# Patient Record
Sex: Female | Born: 1985 | Race: Black or African American | Hispanic: No | Marital: Single | Smoking: Current every day smoker
Health system: Southern US, Community
[De-identification: ages and names within clinical notes are randomized; demographics above are authoritative.]

## PROBLEM LIST (undated history)

## (undated) DIAGNOSIS — F32A Depression, unspecified: Secondary | ICD-10-CM

## (undated) DIAGNOSIS — Z3046 Encounter for surveillance of implantable subdermal contraceptive: Secondary | ICD-10-CM

## (undated) DIAGNOSIS — F419 Anxiety disorder, unspecified: Secondary | ICD-10-CM

## (undated) DIAGNOSIS — F329 Major depressive disorder, single episode, unspecified: Secondary | ICD-10-CM

## (undated) DIAGNOSIS — O24419 Gestational diabetes mellitus in pregnancy, unspecified control: Secondary | ICD-10-CM

## (undated) HISTORY — DX: Depression, unspecified: F32.A

## (undated) HISTORY — DX: Major depressive disorder, single episode, unspecified: F32.9

## (undated) HISTORY — DX: Anxiety disorder, unspecified: F41.9

## (undated) HISTORY — DX: Gestational diabetes mellitus in pregnancy, unspecified control: O24.419

---
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
## 2005-08-14 ENCOUNTER — Inpatient Hospital Stay (HOSPITAL_COMMUNITY): Admission: AD | Admit: 2005-08-14 | Discharge: 2005-08-14 | Payer: Self-pay | Admitting: Family Medicine

## 2005-08-21 ENCOUNTER — Inpatient Hospital Stay (HOSPITAL_COMMUNITY): Admission: AD | Admit: 2005-08-21 | Discharge: 2005-08-21 | Payer: Self-pay | Admitting: *Deleted

## 2006-03-21 ENCOUNTER — Ambulatory Visit: Payer: Self-pay | Admitting: Obstetrics and Gynecology

## 2006-04-08 ENCOUNTER — Inpatient Hospital Stay (HOSPITAL_COMMUNITY): Admission: AD | Admit: 2006-04-08 | Discharge: 2006-04-08 | Payer: Self-pay | Admitting: Gynecology

## 2006-04-27 ENCOUNTER — Ambulatory Visit: Payer: Self-pay | Admitting: Family Medicine

## 2006-05-22 ENCOUNTER — Ambulatory Visit: Payer: Self-pay | Admitting: Obstetrics & Gynecology

## 2006-06-19 ENCOUNTER — Ambulatory Visit: Payer: Self-pay | Admitting: Obstetrics & Gynecology

## 2006-06-19 ENCOUNTER — Ambulatory Visit (HOSPITAL_COMMUNITY): Admission: RE | Admit: 2006-06-19 | Discharge: 2006-06-19 | Payer: Self-pay | Admitting: Obstetrics & Gynecology

## 2006-06-21 ENCOUNTER — Ambulatory Visit (HOSPITAL_COMMUNITY): Admission: RE | Admit: 2006-06-21 | Discharge: 2006-06-21 | Payer: Self-pay | Admitting: Obstetrics & Gynecology

## 2006-06-26 ENCOUNTER — Ambulatory Visit: Payer: Self-pay | Admitting: Obstetrics & Gynecology

## 2006-06-26 ENCOUNTER — Ambulatory Visit (HOSPITAL_COMMUNITY): Admission: RE | Admit: 2006-06-26 | Discharge: 2006-06-26 | Payer: Self-pay | Admitting: Obstetrics & Gynecology

## 2006-07-13 ENCOUNTER — Ambulatory Visit: Payer: Self-pay | Admitting: Obstetrics & Gynecology

## 2006-07-17 ENCOUNTER — Ambulatory Visit (HOSPITAL_COMMUNITY): Admission: RE | Admit: 2006-07-17 | Discharge: 2006-07-17 | Payer: Self-pay | Admitting: Obstetrics & Gynecology

## 2006-07-24 ENCOUNTER — Ambulatory Visit: Payer: Self-pay | Admitting: Obstetrics & Gynecology

## 2006-07-25 ENCOUNTER — Ambulatory Visit (HOSPITAL_COMMUNITY): Admission: RE | Admit: 2006-07-25 | Discharge: 2006-07-25 | Payer: Self-pay | Admitting: Obstetrics & Gynecology

## 2006-07-28 ENCOUNTER — Ambulatory Visit (HOSPITAL_COMMUNITY): Admission: RE | Admit: 2006-07-28 | Discharge: 2006-07-28 | Payer: Self-pay | Admitting: Obstetrics & Gynecology

## 2006-07-29 ENCOUNTER — Inpatient Hospital Stay (HOSPITAL_COMMUNITY): Admission: AD | Admit: 2006-07-29 | Discharge: 2006-07-29 | Payer: Self-pay | Admitting: Gynecology

## 2006-07-31 ENCOUNTER — Ambulatory Visit: Payer: Self-pay | Admitting: Family Medicine

## 2006-07-31 ENCOUNTER — Ambulatory Visit (HOSPITAL_COMMUNITY): Admission: RE | Admit: 2006-07-31 | Discharge: 2006-07-31 | Payer: Self-pay | Admitting: Obstetrics & Gynecology

## 2006-08-03 ENCOUNTER — Inpatient Hospital Stay (HOSPITAL_COMMUNITY): Admission: AD | Admit: 2006-08-03 | Discharge: 2006-08-03 | Payer: Self-pay | Admitting: Gynecology

## 2006-08-03 ENCOUNTER — Ambulatory Visit: Payer: Self-pay | Admitting: *Deleted

## 2006-08-07 ENCOUNTER — Ambulatory Visit: Payer: Self-pay | Admitting: Obstetrics & Gynecology

## 2006-08-10 ENCOUNTER — Ambulatory Visit: Payer: Self-pay | Admitting: Family Medicine

## 2006-08-11 ENCOUNTER — Ambulatory Visit (HOSPITAL_COMMUNITY): Admission: RE | Admit: 2006-08-11 | Discharge: 2006-08-11 | Payer: Self-pay | Admitting: Obstetrics & Gynecology

## 2006-08-14 ENCOUNTER — Ambulatory Visit: Payer: Self-pay | Admitting: *Deleted

## 2006-08-14 ENCOUNTER — Observation Stay (HOSPITAL_COMMUNITY): Admission: RE | Admit: 2006-08-14 | Discharge: 2006-08-15 | Payer: Self-pay | Admitting: Obstetrics & Gynecology

## 2006-08-14 ENCOUNTER — Ambulatory Visit: Payer: Self-pay | Admitting: Gynecology

## 2006-08-16 ENCOUNTER — Ambulatory Visit (HOSPITAL_COMMUNITY): Admission: RE | Admit: 2006-08-16 | Discharge: 2006-08-16 | Payer: Self-pay | Admitting: Obstetrics & Gynecology

## 2006-08-17 ENCOUNTER — Encounter: Payer: Self-pay | Admitting: *Deleted

## 2006-08-18 ENCOUNTER — Encounter: Payer: Self-pay | Admitting: *Deleted

## 2006-08-18 ENCOUNTER — Inpatient Hospital Stay (HOSPITAL_COMMUNITY): Admission: AD | Admit: 2006-08-18 | Discharge: 2006-08-24 | Payer: Self-pay | Admitting: Obstetrics and Gynecology

## 2006-08-18 ENCOUNTER — Ambulatory Visit: Payer: Self-pay | Admitting: Obstetrics & Gynecology

## 2006-08-18 ENCOUNTER — Ambulatory Visit: Payer: Self-pay | Admitting: *Deleted

## 2006-08-18 ENCOUNTER — Ambulatory Visit: Payer: Self-pay | Admitting: Neonatology

## 2006-08-19 ENCOUNTER — Ambulatory Visit: Payer: Self-pay | Admitting: Neonatology

## 2006-08-22 DIAGNOSIS — O365990 Maternal care for other known or suspected poor fetal growth, unspecified trimester, not applicable or unspecified: Secondary | ICD-10-CM

## 2006-08-25 ENCOUNTER — Inpatient Hospital Stay (HOSPITAL_COMMUNITY): Admission: AD | Admit: 2006-08-25 | Discharge: 2006-08-25 | Payer: Self-pay | Admitting: Gynecology

## 2006-08-25 ENCOUNTER — Ambulatory Visit: Payer: Self-pay | Admitting: Obstetrics and Gynecology

## 2006-09-03 ENCOUNTER — Inpatient Hospital Stay (HOSPITAL_COMMUNITY): Admission: AD | Admit: 2006-09-03 | Discharge: 2006-09-03 | Payer: Self-pay | Admitting: Obstetrics & Gynecology

## 2006-10-19 ENCOUNTER — Ambulatory Visit: Payer: Self-pay | Admitting: *Deleted

## 2006-10-19 ENCOUNTER — Inpatient Hospital Stay (HOSPITAL_COMMUNITY): Admission: AD | Admit: 2006-10-19 | Discharge: 2006-10-19 | Payer: Self-pay | Admitting: Obstetrics and Gynecology

## 2007-06-16 ENCOUNTER — Emergency Department (HOSPITAL_COMMUNITY): Admission: EM | Admit: 2007-06-16 | Discharge: 2007-06-16 | Payer: Self-pay | Admitting: Emergency Medicine

## 2007-12-16 ENCOUNTER — Emergency Department (HOSPITAL_COMMUNITY): Admission: EM | Admit: 2007-12-16 | Discharge: 2007-12-16 | Payer: Self-pay | Admitting: Family Medicine

## 2008-05-21 ENCOUNTER — Encounter: Admission: RE | Admit: 2008-05-21 | Discharge: 2008-05-21 | Payer: Self-pay | Admitting: Chiropractic Medicine

## 2008-08-03 ENCOUNTER — Emergency Department (HOSPITAL_COMMUNITY): Admission: EM | Admit: 2008-08-03 | Discharge: 2008-08-03 | Payer: Self-pay | Admitting: Family Medicine

## 2008-08-03 ENCOUNTER — Inpatient Hospital Stay (HOSPITAL_COMMUNITY): Admission: AD | Admit: 2008-08-03 | Discharge: 2008-08-04 | Payer: Self-pay | Admitting: Obstetrics & Gynecology

## 2008-08-04 ENCOUNTER — Emergency Department (HOSPITAL_COMMUNITY): Admission: EM | Admit: 2008-08-04 | Discharge: 2008-08-04 | Payer: Self-pay | Admitting: Emergency Medicine

## 2008-08-06 ENCOUNTER — Inpatient Hospital Stay (HOSPITAL_COMMUNITY): Admission: AD | Admit: 2008-08-06 | Discharge: 2008-08-06 | Payer: Self-pay | Admitting: Family Medicine

## 2008-08-09 ENCOUNTER — Emergency Department (HOSPITAL_COMMUNITY): Admission: EM | Admit: 2008-08-09 | Discharge: 2008-08-09 | Payer: Self-pay | Admitting: Emergency Medicine

## 2008-08-13 ENCOUNTER — Inpatient Hospital Stay (HOSPITAL_COMMUNITY): Admission: RE | Admit: 2008-08-13 | Discharge: 2008-08-13 | Payer: Self-pay | Admitting: Family Medicine

## 2008-08-22 ENCOUNTER — Inpatient Hospital Stay (HOSPITAL_COMMUNITY): Admission: AD | Admit: 2008-08-22 | Discharge: 2008-08-22 | Payer: Self-pay | Admitting: Obstetrics and Gynecology

## 2008-08-28 ENCOUNTER — Ambulatory Visit: Payer: Self-pay | Admitting: Family Medicine

## 2008-09-10 ENCOUNTER — Inpatient Hospital Stay (HOSPITAL_COMMUNITY): Admission: AD | Admit: 2008-09-10 | Discharge: 2008-09-10 | Payer: Self-pay | Admitting: Obstetrics and Gynecology

## 2008-09-11 ENCOUNTER — Ambulatory Visit: Payer: Self-pay | Admitting: Obstetrics & Gynecology

## 2008-09-15 ENCOUNTER — Inpatient Hospital Stay (HOSPITAL_COMMUNITY): Admission: AD | Admit: 2008-09-15 | Discharge: 2008-09-16 | Payer: Self-pay | Admitting: Family Medicine

## 2008-09-22 ENCOUNTER — Ambulatory Visit (HOSPITAL_COMMUNITY): Admission: RE | Admit: 2008-09-22 | Discharge: 2008-09-22 | Payer: Self-pay | Admitting: Family Medicine

## 2008-09-25 ENCOUNTER — Ambulatory Visit: Payer: Self-pay | Admitting: Family Medicine

## 2008-10-01 ENCOUNTER — Inpatient Hospital Stay (HOSPITAL_COMMUNITY): Admission: AD | Admit: 2008-10-01 | Discharge: 2008-10-01 | Payer: Self-pay | Admitting: Family Medicine

## 2008-10-13 ENCOUNTER — Inpatient Hospital Stay (HOSPITAL_COMMUNITY): Admission: AD | Admit: 2008-10-13 | Discharge: 2008-10-13 | Payer: Self-pay | Admitting: Obstetrics & Gynecology

## 2008-10-16 ENCOUNTER — Ambulatory Visit: Payer: Self-pay | Admitting: Family Medicine

## 2008-10-21 ENCOUNTER — Inpatient Hospital Stay (HOSPITAL_COMMUNITY): Admission: AD | Admit: 2008-10-21 | Discharge: 2008-10-21 | Payer: Self-pay | Admitting: Obstetrics & Gynecology

## 2008-10-22 ENCOUNTER — Inpatient Hospital Stay (HOSPITAL_COMMUNITY): Admission: AD | Admit: 2008-10-22 | Discharge: 2008-10-22 | Payer: Self-pay | Admitting: Obstetrics & Gynecology

## 2008-10-30 ENCOUNTER — Ambulatory Visit: Payer: Self-pay | Admitting: Obstetrics & Gynecology

## 2008-11-10 ENCOUNTER — Ambulatory Visit (HOSPITAL_COMMUNITY): Admission: RE | Admit: 2008-11-10 | Discharge: 2008-11-10 | Payer: Self-pay | Admitting: Family Medicine

## 2008-11-13 ENCOUNTER — Ambulatory Visit: Payer: Self-pay | Admitting: Family Medicine

## 2008-11-27 ENCOUNTER — Encounter: Payer: Self-pay | Admitting: Obstetrics & Gynecology

## 2008-11-27 ENCOUNTER — Ambulatory Visit: Payer: Self-pay | Admitting: Family Medicine

## 2008-12-08 ENCOUNTER — Ambulatory Visit (HOSPITAL_COMMUNITY): Admission: RE | Admit: 2008-12-08 | Discharge: 2008-12-08 | Payer: Self-pay | Admitting: Family Medicine

## 2008-12-15 ENCOUNTER — Ambulatory Visit (HOSPITAL_COMMUNITY): Admission: RE | Admit: 2008-12-15 | Discharge: 2008-12-15 | Payer: Self-pay | Admitting: Family Medicine

## 2008-12-22 ENCOUNTER — Ambulatory Visit (HOSPITAL_COMMUNITY): Admission: RE | Admit: 2008-12-22 | Discharge: 2008-12-22 | Payer: Self-pay | Admitting: Family Medicine

## 2008-12-29 ENCOUNTER — Encounter: Payer: Self-pay | Admitting: *Deleted

## 2008-12-29 ENCOUNTER — Inpatient Hospital Stay (HOSPITAL_COMMUNITY): Admission: AD | Admit: 2008-12-29 | Discharge: 2008-12-29 | Payer: Self-pay | Admitting: Obstetrics & Gynecology

## 2008-12-29 ENCOUNTER — Ambulatory Visit: Payer: Self-pay | Admitting: Physician Assistant

## 2009-01-01 ENCOUNTER — Ambulatory Visit: Payer: Self-pay | Admitting: Family Medicine

## 2009-01-05 ENCOUNTER — Ambulatory Visit (HOSPITAL_COMMUNITY): Admission: RE | Admit: 2009-01-05 | Discharge: 2009-01-05 | Payer: Self-pay | Admitting: Family Medicine

## 2009-01-14 ENCOUNTER — Ambulatory Visit (HOSPITAL_COMMUNITY): Admission: RE | Admit: 2009-01-14 | Discharge: 2009-01-14 | Payer: Self-pay | Admitting: Family Medicine

## 2009-01-19 ENCOUNTER — Ambulatory Visit: Payer: Self-pay | Admitting: Obstetrics & Gynecology

## 2009-01-19 ENCOUNTER — Ambulatory Visit (HOSPITAL_COMMUNITY): Admission: RE | Admit: 2009-01-19 | Discharge: 2009-01-19 | Payer: Self-pay | Admitting: Family Medicine

## 2009-01-19 LAB — CONVERTED CEMR LAB

## 2009-01-20 ENCOUNTER — Encounter: Payer: Self-pay | Admitting: Obstetrics & Gynecology

## 2009-01-20 LAB — CONVERTED CEMR LAB
HCT: 31.7 % — ABNORMAL LOW (ref 36.0–46.0)
Hemoglobin: 9.8 g/dL — ABNORMAL LOW (ref 12.0–15.0)
MCHC: 30.9 g/dL (ref 30.0–36.0)
MCV: 83.2 fL (ref 78.0–100.0)
RBC: 3.81 M/uL — ABNORMAL LOW (ref 3.87–5.11)

## 2009-01-26 ENCOUNTER — Ambulatory Visit (HOSPITAL_COMMUNITY): Admission: RE | Admit: 2009-01-26 | Discharge: 2009-01-26 | Payer: Self-pay | Admitting: Family Medicine

## 2009-01-26 ENCOUNTER — Encounter: Payer: Self-pay | Admitting: Family Medicine

## 2009-02-02 ENCOUNTER — Observation Stay (HOSPITAL_COMMUNITY): Admission: AD | Admit: 2009-02-02 | Discharge: 2009-02-02 | Payer: Self-pay | Admitting: Obstetrics & Gynecology

## 2009-02-02 ENCOUNTER — Encounter: Payer: Self-pay | Admitting: Family Medicine

## 2009-02-02 ENCOUNTER — Ambulatory Visit: Payer: Self-pay | Admitting: Family Medicine

## 2009-02-03 ENCOUNTER — Inpatient Hospital Stay (HOSPITAL_COMMUNITY): Admission: AD | Admit: 2009-02-03 | Discharge: 2009-02-03 | Payer: Self-pay | Admitting: Obstetrics & Gynecology

## 2009-02-05 ENCOUNTER — Inpatient Hospital Stay (HOSPITAL_COMMUNITY): Admission: AD | Admit: 2009-02-05 | Discharge: 2009-02-05 | Payer: Self-pay | Admitting: Obstetrics & Gynecology

## 2009-02-05 ENCOUNTER — Ambulatory Visit: Payer: Self-pay | Admitting: Family

## 2009-02-09 ENCOUNTER — Ambulatory Visit (HOSPITAL_COMMUNITY): Admission: RE | Admit: 2009-02-09 | Discharge: 2009-02-09 | Payer: Self-pay | Admitting: Obstetrics & Gynecology

## 2009-02-17 ENCOUNTER — Ambulatory Visit (HOSPITAL_COMMUNITY): Admission: RE | Admit: 2009-02-17 | Discharge: 2009-02-17 | Payer: Self-pay | Admitting: Obstetrics & Gynecology

## 2009-02-19 ENCOUNTER — Ambulatory Visit (HOSPITAL_COMMUNITY): Admission: RE | Admit: 2009-02-19 | Discharge: 2009-02-19 | Payer: Self-pay | Admitting: Obstetrics & Gynecology

## 2009-02-23 ENCOUNTER — Ambulatory Visit (HOSPITAL_COMMUNITY): Admission: RE | Admit: 2009-02-23 | Discharge: 2009-02-23 | Payer: Self-pay | Admitting: Obstetrics & Gynecology

## 2009-02-27 ENCOUNTER — Ambulatory Visit (HOSPITAL_COMMUNITY): Admission: RE | Admit: 2009-02-27 | Discharge: 2009-02-27 | Payer: Self-pay | Admitting: Obstetrics & Gynecology

## 2009-03-02 ENCOUNTER — Ambulatory Visit (HOSPITAL_COMMUNITY): Admission: RE | Admit: 2009-03-02 | Discharge: 2009-03-02 | Payer: Self-pay | Admitting: Obstetrics & Gynecology

## 2009-03-05 ENCOUNTER — Inpatient Hospital Stay (HOSPITAL_COMMUNITY): Admission: AD | Admit: 2009-03-05 | Discharge: 2009-03-05 | Payer: Self-pay | Admitting: Obstetrics & Gynecology

## 2009-03-06 ENCOUNTER — Inpatient Hospital Stay (HOSPITAL_COMMUNITY): Admission: AD | Admit: 2009-03-06 | Discharge: 2009-03-06 | Payer: Self-pay | Admitting: Obstetrics & Gynecology

## 2009-03-09 ENCOUNTER — Encounter: Payer: Self-pay | Admitting: Obstetrics & Gynecology

## 2009-03-09 ENCOUNTER — Inpatient Hospital Stay (HOSPITAL_COMMUNITY): Admission: RE | Admit: 2009-03-09 | Discharge: 2009-03-12 | Payer: Self-pay | Admitting: Obstetrics & Gynecology

## 2009-03-09 ENCOUNTER — Ambulatory Visit: Payer: Self-pay | Admitting: Obstetrics & Gynecology

## 2009-03-09 DIAGNOSIS — Z98891 History of uterine scar from previous surgery: Secondary | ICD-10-CM

## 2009-07-02 ENCOUNTER — Emergency Department (HOSPITAL_COMMUNITY): Admission: EM | Admit: 2009-07-02 | Discharge: 2009-07-02 | Payer: Self-pay | Admitting: Family Medicine

## 2009-08-05 ENCOUNTER — Inpatient Hospital Stay (HOSPITAL_COMMUNITY): Admission: AD | Admit: 2009-08-05 | Discharge: 2009-08-06 | Payer: Self-pay | Admitting: Obstetrics & Gynecology

## 2009-08-12 ENCOUNTER — Emergency Department (HOSPITAL_COMMUNITY): Admission: EM | Admit: 2009-08-12 | Discharge: 2009-08-12 | Payer: Self-pay | Admitting: Emergency Medicine

## 2009-08-16 ENCOUNTER — Inpatient Hospital Stay (HOSPITAL_COMMUNITY): Admission: AD | Admit: 2009-08-16 | Discharge: 2009-08-17 | Payer: Self-pay | Admitting: Obstetrics and Gynecology

## 2009-08-17 ENCOUNTER — Ambulatory Visit (HOSPITAL_COMMUNITY): Admission: AD | Admit: 2009-08-17 | Discharge: 2009-08-17 | Payer: Self-pay | Admitting: Family Medicine

## 2009-08-17 ENCOUNTER — Ambulatory Visit: Payer: Self-pay | Admitting: Obstetrics & Gynecology

## 2009-08-19 ENCOUNTER — Emergency Department (HOSPITAL_COMMUNITY): Admission: EM | Admit: 2009-08-19 | Discharge: 2009-08-19 | Payer: Self-pay | Admitting: Emergency Medicine

## 2009-08-20 ENCOUNTER — Inpatient Hospital Stay (HOSPITAL_COMMUNITY): Admission: AD | Admit: 2009-08-20 | Discharge: 2009-08-20 | Payer: Self-pay | Admitting: Family Medicine

## 2009-08-20 ENCOUNTER — Emergency Department (HOSPITAL_COMMUNITY): Admission: EM | Admit: 2009-08-20 | Discharge: 2009-08-20 | Payer: Self-pay | Admitting: Emergency Medicine

## 2009-08-20 ENCOUNTER — Ambulatory Visit: Payer: Self-pay | Admitting: Obstetrics & Gynecology

## 2009-11-06 ENCOUNTER — Emergency Department (HOSPITAL_COMMUNITY): Admission: EM | Admit: 2009-11-06 | Discharge: 2009-11-06 | Payer: Self-pay | Admitting: Emergency Medicine

## 2009-11-09 ENCOUNTER — Inpatient Hospital Stay (HOSPITAL_COMMUNITY): Admission: AD | Admit: 2009-11-09 | Discharge: 2009-11-09 | Payer: Self-pay | Admitting: Family Medicine

## 2009-11-11 ENCOUNTER — Ambulatory Visit (HOSPITAL_COMMUNITY): Admission: RE | Admit: 2009-11-11 | Discharge: 2009-11-11 | Payer: Self-pay | Admitting: Obstetrics & Gynecology

## 2009-11-17 ENCOUNTER — Emergency Department (HOSPITAL_COMMUNITY): Admission: EM | Admit: 2009-11-17 | Discharge: 2009-11-17 | Payer: Self-pay | Admitting: Emergency Medicine

## 2009-11-27 IMAGING — US US FETAL BPP W/O NONSTRESS
1 series · 3 of 3 positions shown · non-contrast
Comparison: none

OBSTETRICAL ULTRASOUND:
 This ultrasound exam was performed in the [HOSPITAL] Ultrasound Department.  The OB US report was generated in the AS system, and faxed to the ordering physician.  This report is also available in [REDACTED] PACS.

[Series 1: us fetal bpp w/o nonstress · non-contrast · 3 of 3 slices shown]
[im 1/3]
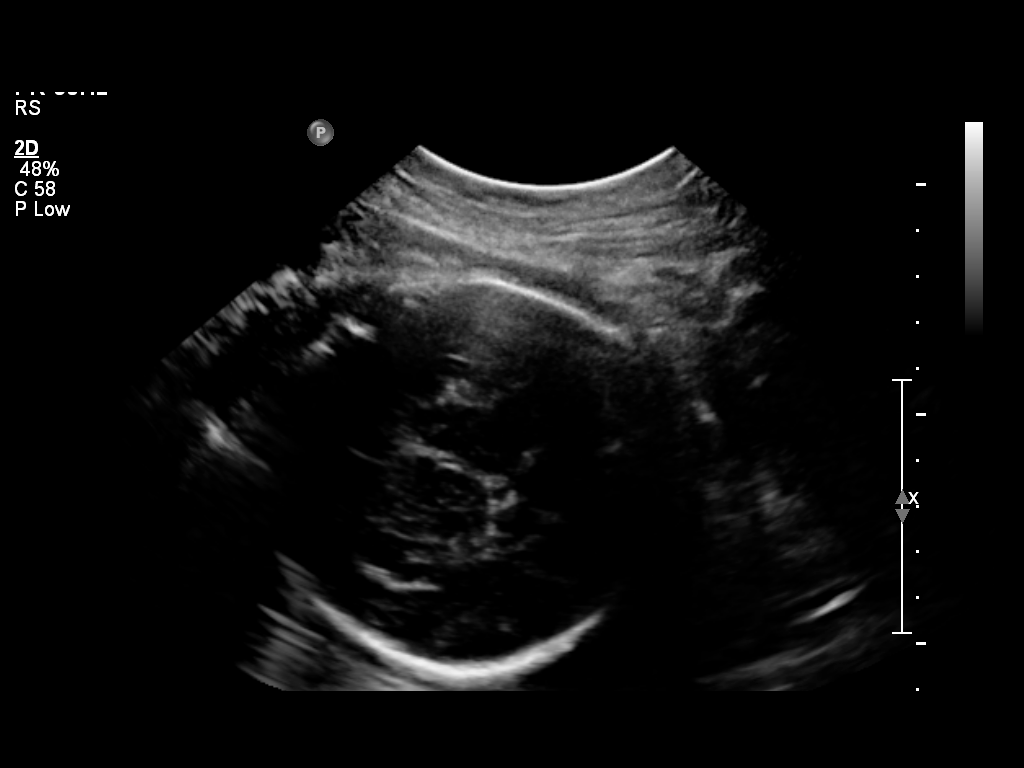
[im 2/3]
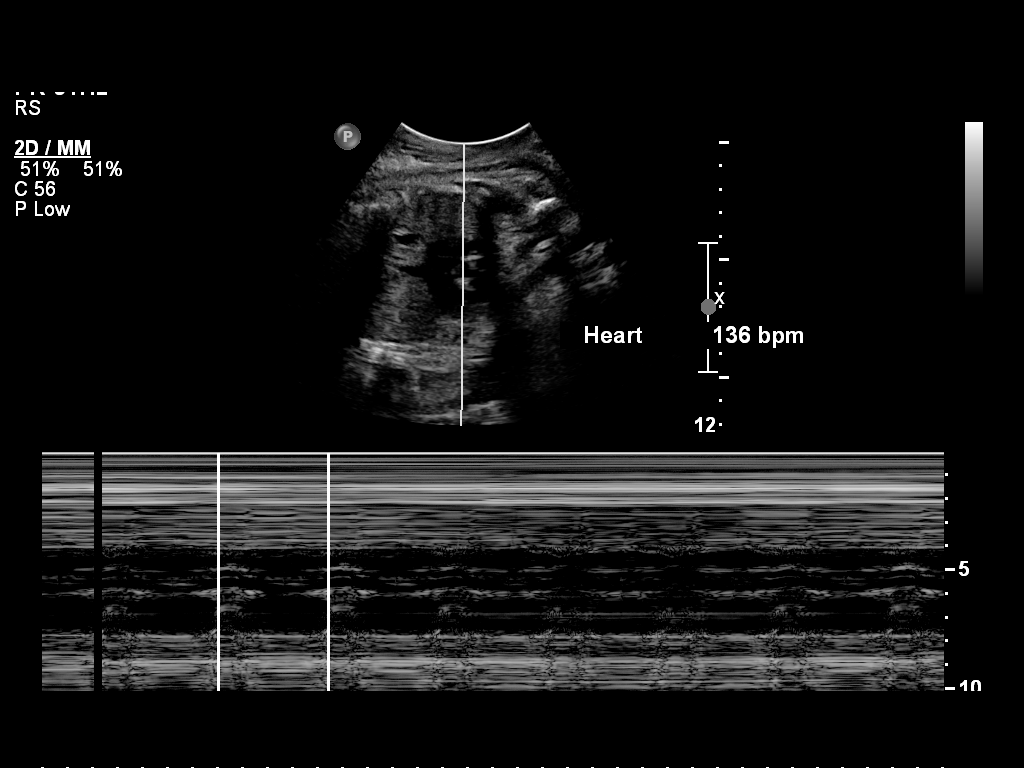
[im 3/3]
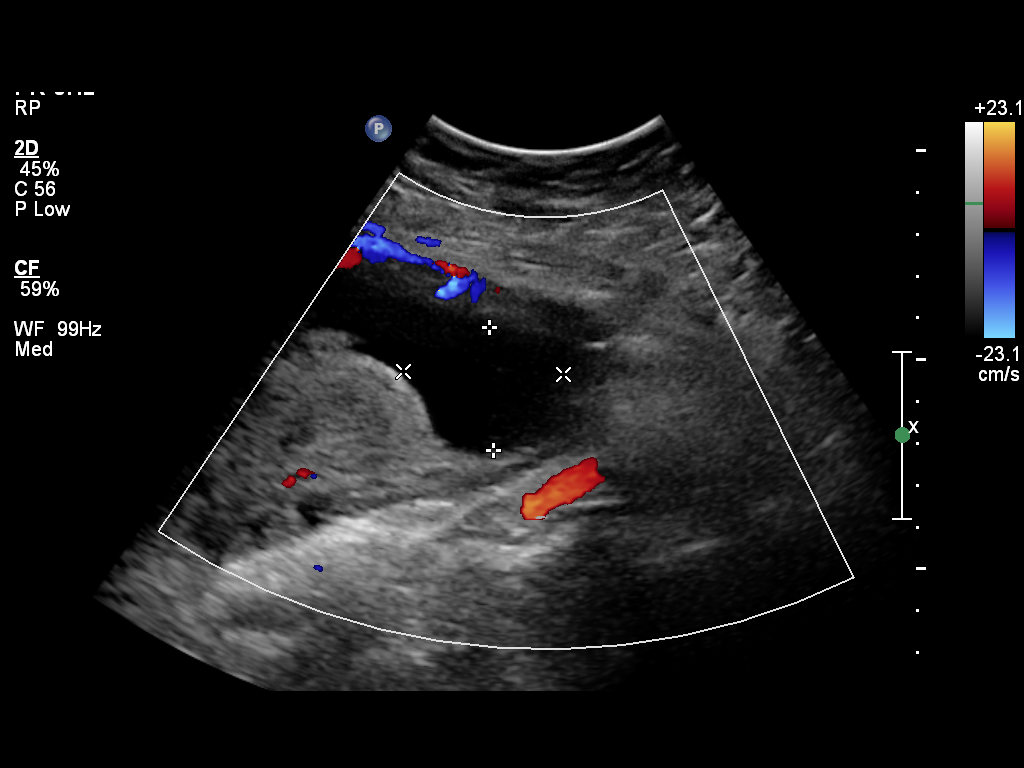

[3 of 3 positions shown; findings below may reference images not displayed]

IMPRESSION: See AS Obstetric US report.

## 2010-03-23 ENCOUNTER — Emergency Department (HOSPITAL_COMMUNITY): Admission: EM | Admit: 2010-03-23 | Discharge: 2010-03-23 | Payer: Self-pay | Admitting: Emergency Medicine

## 2010-04-04 ENCOUNTER — Emergency Department (HOSPITAL_COMMUNITY): Admission: EM | Admit: 2010-04-04 | Discharge: 2010-04-04 | Payer: Self-pay | Admitting: Family Medicine

## 2010-08-04 ENCOUNTER — Emergency Department (HOSPITAL_COMMUNITY)
Admission: EM | Admit: 2010-08-04 | Discharge: 2010-08-04 | Payer: Self-pay | Source: Home / Self Care | Admitting: Emergency Medicine

## 2010-09-05 ENCOUNTER — Encounter: Payer: Self-pay | Admitting: Specialist

## 2010-09-05 ENCOUNTER — Encounter: Payer: Self-pay | Admitting: Family Medicine

## 2010-09-05 ENCOUNTER — Encounter: Payer: Self-pay | Admitting: *Deleted

## 2010-09-06 ENCOUNTER — Encounter: Payer: Self-pay | Admitting: Obstetrics & Gynecology

## 2010-09-18 ENCOUNTER — Inpatient Hospital Stay (INDEPENDENT_AMBULATORY_CARE_PROVIDER_SITE_OTHER)
Admission: RE | Admit: 2010-09-18 | Discharge: 2010-09-18 | Disposition: A | Discharge status: Home / Self Care | Payer: Medicaid Other | Source: Ambulatory Visit | Attending: Family Medicine | Admitting: Family Medicine

## 2010-09-18 DIAGNOSIS — N760 Acute vaginitis: Secondary | ICD-10-CM

## 2010-09-18 LAB — WET PREP, GENITAL

## 2010-09-18 LAB — POCT URINALYSIS DIPSTICK
Protein, ur: 30 mg/dL — AB
Specific Gravity, Urine: 1.025 (ref 1.005–1.030)

## 2010-09-18 LAB — POCT PREGNANCY, URINE: Preg Test, Ur: NEGATIVE

## 2010-09-20 LAB — GC/CHLAMYDIA PROBE AMP, GENITAL: GC Probe Amp, Genital: NEGATIVE

## 2010-10-31 LAB — URINALYSIS, ROUTINE W REFLEX MICROSCOPIC
Bilirubin Urine: NEGATIVE
Protein, ur: 30 mg/dL — AB
Urobilinogen, UA: 1 mg/dL (ref 0.0–1.0)

## 2010-10-31 LAB — URINE CULTURE: Colony Count: >=100000

## 2010-10-31 LAB — POCT I-STAT, CHEM 8
Creatinine, Ser: 0.7 mg/dL (ref 0.4–1.2)
Glucose, Bld: 81 mg/dL (ref 70–99)
Hemoglobin: 9.5 g/dL — ABNORMAL LOW (ref 12.0–15.0)
Sodium: 139 mEq/L (ref 135–145)
TCO2: 26 mmol/L (ref 0–100)

## 2010-10-31 LAB — CBC
HCT: 28.5 % — ABNORMAL LOW (ref 36.0–46.0)
HCT: 32.9 % — ABNORMAL LOW (ref 36.0–46.0)
Hemoglobin: 9.3 g/dL — ABNORMAL LOW (ref 12.0–15.0)
Platelets: 306 10*3/uL (ref 150–400)
Platelets: 322 10*3/uL (ref 150–400)
Platelets: 351 10*3/uL (ref 150–400)
RDW: 17 % — ABNORMAL HIGH (ref 11.5–15.5)
RDW: 17.1 % — ABNORMAL HIGH (ref 11.5–15.5)
WBC: 9.9 10*3/uL (ref 4.0–10.5)

## 2010-10-31 LAB — GC/CHLAMYDIA PROBE AMP, GENITAL
Chlamydia, DNA Probe: NEGATIVE
GC Probe Amp, Genital: POSITIVE — AB

## 2010-10-31 LAB — URINE MICROSCOPIC-ADD ON

## 2010-10-31 LAB — DIFFERENTIAL
Basophils Absolute: 0.1 10*3/uL (ref 0.0–0.1)
Eosinophils Relative: 1 % (ref 0–5)
Lymphocytes Relative: 32 % (ref 12–46)
Neutro Abs: 4.6 10*3/uL (ref 1.7–7.7)

## 2010-10-31 LAB — BASIC METABOLIC PANEL
BUN: 4 mg/dL — ABNORMAL LOW (ref 6–23)
Calcium: 8.7 mg/dL (ref 8.4–10.5)
GFR calc non Af Amer: >60 mL/min (ref >60)
Glucose, Bld: 75 mg/dL (ref 70–99)

## 2010-10-31 LAB — HCG, QUANTITATIVE, PREGNANCY: hCG, Beta Chain, Quant, S: 4159 m[IU]/mL — ABNORMAL HIGH (ref <5)

## 2010-11-07 LAB — WET PREP, GENITAL: Clue Cells Wet Prep HPF POC: NONE SEEN

## 2010-11-17 LAB — POCT URINALYSIS DIP (DEVICE)
Hgb urine dipstick: NEGATIVE
Protein, ur: NEGATIVE mg/dL
Specific Gravity, Urine: 1.02 (ref 1.005–1.030)
Urobilinogen, UA: 0.2 mg/dL (ref 0.0–1.0)

## 2010-11-17 LAB — WET PREP, GENITAL: Trich, Wet Prep: NONE SEEN

## 2010-11-21 LAB — CBC
HCT: 27.5 % — ABNORMAL LOW (ref 36.0–46.0)
HCT: 35.5 % — ABNORMAL LOW (ref 36.0–46.0)
Hemoglobin: 11.3 g/dL — ABNORMAL LOW (ref 12.0–15.0)
Hemoglobin: 9 g/dL — ABNORMAL LOW (ref 12.0–15.0)
MCHC: 32.8 g/dL (ref 30.0–36.0)
MCV: 81 fL (ref 78.0–100.0)
MCV: 81.5 fL (ref 78.0–100.0)
RBC: 3.37 MIL/uL — ABNORMAL LOW (ref 3.87–5.11)
RBC: 4.22 MIL/uL (ref 3.87–5.11)
RBC: 4.39 MIL/uL (ref 3.87–5.11)
WBC: 7.3 10*3/uL (ref 4.0–10.5)
WBC: 7.8 10*3/uL (ref 4.0–10.5)

## 2010-11-21 LAB — COMPREHENSIVE METABOLIC PANEL
AST: 27 U/L (ref 0–37)
BUN: 3 mg/dL — ABNORMAL LOW (ref 6–23)
CO2: 23 mEq/L (ref 19–32)
Calcium: 9.2 mg/dL (ref 8.4–10.5)
Creatinine, Ser: 0.56 mg/dL (ref 0.4–1.2)
GFR calc Af Amer: >60 mL/min (ref >60)
GFR calc non Af Amer: >60 mL/min (ref >60)
Glucose, Bld: 78 mg/dL (ref 70–99)
Total Bilirubin: 0.3 mg/dL (ref 0.3–1.2)

## 2010-11-21 LAB — RPR: RPR Ser Ql: NONREACTIVE

## 2010-11-21 LAB — URIC ACID: Uric Acid, Serum: 6.4 mg/dL (ref 2.4–7.0)

## 2010-11-21 LAB — URINALYSIS, ROUTINE W REFLEX MICROSCOPIC
Hgb urine dipstick: NEGATIVE
Ketones, ur: NEGATIVE mg/dL
Protein, ur: NEGATIVE mg/dL
Urobilinogen, UA: 0.2 mg/dL (ref 0.0–1.0)

## 2010-11-21 LAB — URINE MICROSCOPIC-ADD ON

## 2010-11-22 LAB — COMPREHENSIVE METABOLIC PANEL
Albumin: 2.4 g/dL — ABNORMAL LOW (ref 3.5–5.2)
Alkaline Phosphatase: 349 U/L — ABNORMAL HIGH (ref 39–117)
BUN: 4 mg/dL — ABNORMAL LOW (ref 6–23)
Creatinine, Ser: 0.55 mg/dL (ref 0.4–1.2)
Glucose, Bld: 97 mg/dL (ref 70–99)
Total Bilirubin: 0.3 mg/dL (ref 0.3–1.2)
Total Protein: 5.9 g/dL — ABNORMAL LOW (ref 6.0–8.3)

## 2010-11-22 LAB — RAPID URINE DRUG SCREEN, HOSP PERFORMED
Cocaine: NOT DETECTED
Tetrahydrocannabinol: NOT DETECTED

## 2010-11-22 LAB — POCT URINALYSIS DIP (DEVICE)
Bilirubin Urine: NEGATIVE
Glucose, UA: NEGATIVE mg/dL
Hgb urine dipstick: NEGATIVE
Ketones, ur: NEGATIVE mg/dL
Specific Gravity, Urine: 1.025 (ref 1.005–1.030)

## 2010-11-22 LAB — WET PREP, GENITAL
Clue Cells Wet Prep HPF POC: NONE SEEN
Trich, Wet Prep: NONE SEEN

## 2010-11-22 LAB — CBC
HCT: 30.2 % — ABNORMAL LOW (ref 36.0–46.0)
Hemoglobin: 10.1 g/dL — ABNORMAL LOW (ref 12.0–15.0)
MCV: 82.5 fL (ref 78.0–100.0)
Platelets: 203 10*3/uL (ref 150–400)
RDW: 13 % (ref 11.5–15.5)

## 2010-11-22 LAB — CREATININE CLEARANCE, URINE, 24 HOUR
Collection Interval-CRCL: 24 hours
Creatinine Clearance: 131 mL/min — ABNORMAL HIGH (ref 75–115)
Creatinine, 24H Ur: 1039 mg/d (ref 700–1800)

## 2010-11-22 LAB — PROTEIN, URINE, 24 HOUR
Collection Interval-UPROT: 24 hours
Protein, 24H Urine: 96 mg/d (ref 50–100)
Protein, Urine: 12 mg/dL

## 2010-11-23 LAB — CBC
HCT: 32.3 % — ABNORMAL LOW (ref 36.0–46.0)
Hemoglobin: 11.2 g/dL — ABNORMAL LOW (ref 12.0–15.0)
MCHC: 34.7 g/dL (ref 30.0–36.0)
MCV: 83.5 fL (ref 78.0–100.0)
RBC: 3.87 MIL/uL (ref 3.87–5.11)
WBC: 9 10*3/uL (ref 4.0–10.5)

## 2010-11-23 LAB — COMPREHENSIVE METABOLIC PANEL
AST: 22 U/L (ref 0–37)
BUN: 6 mg/dL (ref 6–23)
CO2: 23 mEq/L (ref 19–32)
Calcium: 9.2 mg/dL (ref 8.4–10.5)
Chloride: 106 mEq/L (ref 96–112)
Creatinine, Ser: 0.47 mg/dL (ref 0.4–1.2)
GFR calc Af Amer: >60 mL/min (ref >60)
GFR calc non Af Amer: >60 mL/min (ref >60)
Glucose, Bld: 92 mg/dL (ref 70–99)
Total Bilirubin: 0.2 mg/dL — ABNORMAL LOW (ref 0.3–1.2)

## 2010-11-24 LAB — POCT URINALYSIS DIP (DEVICE)
Bilirubin Urine: NEGATIVE
Glucose, UA: NEGATIVE mg/dL
Glucose, UA: NEGATIVE mg/dL
Nitrite: NEGATIVE
Protein, ur: 100 mg/dL — AB
Specific Gravity, Urine: 1.02 (ref 1.005–1.030)
Urobilinogen, UA: 0.2 mg/dL (ref 0.0–1.0)

## 2010-11-25 LAB — CBC
Hemoglobin: 10.8 g/dL — ABNORMAL LOW (ref 12.0–15.0)
RBC: 3.96 MIL/uL (ref 3.87–5.11)

## 2010-11-25 LAB — URINALYSIS, ROUTINE W REFLEX MICROSCOPIC
Ketones, ur: NEGATIVE mg/dL
Nitrite: NEGATIVE
Urobilinogen, UA: 0.2 mg/dL (ref 0.0–1.0)
pH: 6.5 (ref 5.0–8.0)

## 2010-11-25 LAB — POCT URINALYSIS DIP (DEVICE)
Bilirubin Urine: NEGATIVE
Bilirubin Urine: NEGATIVE
Glucose, UA: NEGATIVE mg/dL
Glucose, UA: NEGATIVE mg/dL
Hgb urine dipstick: NEGATIVE
Hgb urine dipstick: NEGATIVE
Ketones, ur: NEGATIVE mg/dL
Ketones, ur: NEGATIVE mg/dL
Nitrite: NEGATIVE
Specific Gravity, Urine: 1.02 (ref 1.005–1.030)
Urobilinogen, UA: 1 mg/dL (ref 0.0–1.0)

## 2010-11-25 LAB — WET PREP, GENITAL
Clue Cells Wet Prep HPF POC: NONE SEEN
Clue Cells Wet Prep HPF POC: NONE SEEN
Trich, Wet Prep: NONE SEEN

## 2010-11-29 LAB — URINALYSIS, ROUTINE W REFLEX MICROSCOPIC
Bilirubin Urine: NEGATIVE
Bilirubin Urine: NEGATIVE
Hgb urine dipstick: NEGATIVE
Hgb urine dipstick: NEGATIVE
Nitrite: NEGATIVE
Specific Gravity, Urine: >1.03 — ABNORMAL HIGH (ref 1.005–1.030)
Specific Gravity, Urine: 1.02 (ref 1.005–1.030)
Urobilinogen, UA: 0.2 mg/dL (ref 0.0–1.0)
pH: 5.5 (ref 5.0–8.0)
pH: 7 (ref 5.0–8.0)

## 2010-11-29 LAB — POCT URINALYSIS DIP (DEVICE)
Bilirubin Urine: NEGATIVE
Bilirubin Urine: NEGATIVE
Glucose, UA: NEGATIVE mg/dL
Glucose, UA: NEGATIVE mg/dL
Hgb urine dipstick: NEGATIVE
Ketones, ur: NEGATIVE mg/dL
Ketones, ur: NEGATIVE mg/dL
Nitrite: NEGATIVE
pH: 6 (ref 5.0–8.0)
pH: 7.5 (ref 5.0–8.0)

## 2010-11-29 LAB — GC/CHLAMYDIA PROBE AMP, GENITAL
Chlamydia, DNA Probe: NEGATIVE
GC Probe Amp, Genital: NEGATIVE

## 2010-11-30 LAB — URINALYSIS, ROUTINE W REFLEX MICROSCOPIC
Bilirubin Urine: NEGATIVE
Glucose, UA: NEGATIVE mg/dL
Hgb urine dipstick: NEGATIVE
Ketones, ur: NEGATIVE mg/dL
Protein, ur: NEGATIVE mg/dL
Urobilinogen, UA: 0.2 mg/dL (ref 0.0–1.0)

## 2010-11-30 LAB — GC/CHLAMYDIA PROBE AMP, GENITAL
Chlamydia, DNA Probe: NEGATIVE
GC Probe Amp, Genital: NEGATIVE

## 2010-11-30 LAB — POCT URINALYSIS DIP (DEVICE)
Glucose, UA: NEGATIVE mg/dL
Hgb urine dipstick: NEGATIVE
Nitrite: NEGATIVE
Protein, ur: 30 mg/dL — AB
Specific Gravity, Urine: 1.02 (ref 1.005–1.030)
Urobilinogen, UA: 0.2 mg/dL (ref 0.0–1.0)
pH: 7.5 (ref 5.0–8.0)

## 2010-11-30 LAB — WET PREP, GENITAL
Clue Cells Wet Prep HPF POC: NONE SEEN
Trich, Wet Prep: NONE SEEN
Trich, Wet Prep: NONE SEEN
Yeast Wet Prep HPF POC: NONE SEEN

## 2010-12-28 NOTE — Discharge Summary (Signed)
NAMESARANDA, Meredith Jackson NO.:  [id number]   MEDICAL RECORD NO.:  [id number]          PATIENT TYPE:  INP   LOCATION:  9126                          [facility]:  [facility]   PHYSICIAN:  Scheryl Darter, MD       DATE OF BIRTH:  Jan 19, 1986   DATE OF ADMISSION:  03/09/2009  DATE OF DISCHARGE:  03/12/2009                               DISCHARGE SUMMARY   PRIMARY CARE [name]:  [facility].   DISCHARGE DIAGNOSES:  1. Repeat low-transverse cesarean section.  2. Management of history of classical cesarean section.   DISCHARGE MEDICATIONS:  1. Percocet 5/325, 1-2 tabs p.o. q.4-6 h. p.r.n. pain.  2. Ibuprofen 600 mg p.o. q.8 h. p.r.n. pain.  3. Iron sulfate 325 mg twice a day.  4. Colace 100 mg twice a day.  5. Prenatal vitamins once a day.   PROCEDURES:  Repeat low-transverse cesarean section.   SURGEON:  Scheryl Darter, MD   DATE:  March 09, 2009.   Please see op note for the details.   LABORATORY DATA:  Discharge hemoglobin was 9, discharge hematocrit was  27.5, and discharge platelets were in the 140s.   BRIEF HOSPITAL COURSE:  A 25 year old G8, P1-2-2-3 who had a repeat low-  transverse cesarean section at 36 weeks due to a history of classical  cesarean section.  Apgars were 8 and 9, baby weighed 7 pounds 4 ounces.  Estimated blood loss was 600 mL.  There were no postop complications.  Mom is breastfeeding.  She desires IUD for contraception at 6 weeks.   DISCHARGE INSTRUCTIONS:  Routine for cesarean section.   FOLLOWUP:  Followup appointments at [facility] or Health  Department in 6 weeks.   DISCHARGE CONDITION:  The patient was discharged in stable medical  condition.      Clementeen Graham, MD      Scheryl Darter, MD  Electronically Signed    EC/MEDQ  D:  03/12/2009  T:  03/12/2009  Job:  [id number]

## 2010-12-28 NOTE — Op Note (Signed)
[name], [name] NO.:  [id number]   MEDICAL RECORD NO.:  [id number]          PATIENT TYPE:  INP   LOCATION:  9126                          [facility]   PHYSICIAN:  Scheryl Darter, MD       DATE OF BIRTH:  12/21/1985   DATE OF PROCEDURE:  DATE OF DISCHARGE:                               OPERATIVE REPORT   PROCEDURE:  Repeat low transverse cesarean section.   PREOPERATIVE DIAGNOSES:  1. Intrauterine pregnancy at 39 weeks' gestation.  2. Previous classical cesarean section.   POSTOPERATIVE DIAGNOSES:  1. Intrauterine pregnancy, delivered.  2. Live born female, Apgars 8 and 9, weight 4 pounds.   SURGEON:  Scheryl Darter, MD   ANESTHESIA:  Spinal.   ESTIMATED BLOOD LOSS:  600 mL.   SPECIMENS:  Placenta.   DRAIN:  Foley catheter.   COMPLICATIONS:  None.   COUNTS:  Correct.   The patient gave written consent for repeat cesarean section at 36  weeks' gestation due to previous delivery by a classical cesarean  section.  The patient's identification was confirmed.  She was brought  to the OR and spinal anesthesia was induced.  She was placed in dorsal  supine position with left lateral tilt.  Foley catheter was placed, and  abdomen was sterilely prepped and draped.  A #10 blade was used to make  a Pfannenstiel incision at the site of her previous transverse lower  abdominal incision.  Incision was carried down to the fascia, and the  fascia was incised.  Incision was extended transversely with curved Mayo  scissors.  Hemostasis was obtained with cautery.  Fascia was separated  from underlying tissue attachments and scar with blunt and sharp  dissection.  Hemostasis was obtained with cautery.  Bellies of the  rectus muscles were separated, and the peritoneum was identified and  elevated and incised with Metzenbaum scissors, and the incision was  extended vertically.  The bladder blade was inserted.  There were some  adhesions of the bladder to the  anterior uterine wall, and these were  dissected with Metzenbaum scissors and a transverse bladder flap was  created.  Bladder blade was reinserted.  Lower uterine segment was  entered in the midline with #10 blade and clear fluid was expressed.  The incision was extended transversely with blunt traction.  Fetal head  was elevated and delivered without difficulty and the infant was  delivered.  Mouth and nose were clear and bulb suctioned.  There was  nuchal cord.  Infant was vigorous at birth and was a live born female, 4  pounds with Apgars 8 and 9.  Cord was clamped and cut, and the infant  was handed to Dr. Christella Scheuermann and nursery personnel.  The placenta was  delivered and then uterine cavity was explored.  The patient received IV  Pitocin.  She has received IV Kefzol prior to the incision.  The bladder  blade was reinserted, and the 0 Vicryl was used to close the uterine  incision with a running locking suture.  Imbricating layer followed, and  hemostatic sutures were  placed at both angles of the incision with good  hemostasis seen.  Pelvic gutters were irrigated.  The anterior  peritoneum was closed with a running suture with 2-0 Vicryl.  Fascia was  closed with running suture of 0 Vicryl.  Good hemostasis was assured in  the incision, and the skin was closed with a running subcuticular suture  with 4-0 Vicryl.  The patient tolerated the procedure well without  complication.  She was brought in stable condition to recovery room.      Scheryl Darter, MD  Electronically Signed     JA/MEDQ  D:  03/09/2009  T:  03/10/2009  Job:  [id number]

## 2010-12-28 NOTE — Discharge Summary (Signed)
Meredith Jackson, Meredith Jackson                ACCOUNT NO.:  [id number]   MEDICAL RECORD NO.:  [id number]          PATIENT TYPE:  MAT   LOCATION:  MATC                          [facility]  [facility]   PHYSICIAN:  Horton Chin, MD DATE OF BIRTH:  23-Aug-1985   DATE OF ADMISSION:  03/05/2009  DATE OF DISCHARGE:  03/05/2009                               DISCHARGE SUMMARY   Of note, the patient left against medical advice.   ADMISSION DIAGNOSES:  1. Biophysical profile of 6/10.  The patient lost points for no fetal      breathing movements or any gross fetal movement.  2. History of a prior stillbirth at 38 weeks.  3. Concern about possible gestational hypertension or preeclampsia.   DISCHARGE DIAGNOSES:  1. Biophysical profile of 6/10.  The patient lost points for no fetal      breathing movements or any gross fetal movement.  2. History of a prior stillbirth at 38 weeks.  3. Concern about possible gestational hypertension or preeclampsia.   BRIEF HOSPITAL COURSE:  The patient is a 25 year old, gravida 5, para 1-  1-2-1 at 35-3/7 weeks, who was admitted today following a nonreactive  NST at MFM office, and a BPP of 6/10.  The patient has a history of an  IUFD and a classical cesarean section and is scheduled for a repeat  cesarean section on Monday July 26.  On admission, the patient was noted  to have an isolated systolic blood pressure in the 140s and an isolated  diastolic blood pressure in the 90s and PIH evaluation labs were ordered  and found to be normal.  However, the plan was to collect a 24-hour  urine while the patient was admitted and shortly after admission, the  patient did say that time she did want to go home because of some issues  at home with her child.  The risks of not being monitored in the setting  of a biophysical of 6/10, and the risks of intrauterine fetal demise  were discussed with the patient, and she still insisted that she would  still want to go home.  She signed  the leaving against medical advice  form and verbalized that she is aware of the risks of going home against  medical advice.  The patient was advised to come back if she does not  feel the baby move or for any other labor signs or symptoms.  She was  also told to come back in tomorrow morning for a repeat biophysical  profile.  The patient was given the plan for 24-hour urine collection  and told to return this in 24 hours.  Of note before the patient left,  her fetal heart rate tracing was reactive with baseline in 140s and  150s, and she was in no distress.      Horton Chin, MD  Electronically Signed     UAA/MEDQ  D:  03/05/2009  T:  03/06/2009  Job:  [id number]

## 2010-12-31 NOTE — Op Note (Signed)
Meredith Jackson, Meredith Jackson                ACCOUNT NO.:  [id number]   MEDICAL RECORD NO.:  [id number]          PATIENT TYPE:  INP   LOCATION:                                [facility]   PHYSICIAN:  Allie Bossier, MD        DATE OF BIRTH:  1986/01/02   DATE OF PROCEDURE:  DATE OF DISCHARGE:                               OPERATIVE REPORT   PREOPERATIVE DIAGNOSIS:  Twenty-nine weeks, abnormal fetal Dopplers and  breech presentation.   SURGEON:  Myra C. Marice Potter, M.D. and Blima Rich, M.D.   COMPLICATIONS:  None.   ESTIMATED BLOOD LOSS:  500 mL.   PROCEDURE:  Classical cesarean section with delivery of living female  infant.   FINDINGS:  Living female infant, Apgars 4 and 6 at one and five minutes,  weight 1 pound 4 ounces.   DETAILED PROCEDURE AND FINDINGS:  In the patient's room, risks of  surgery including infection and bleeding were explained, understood and  accepted. I also counseled her that a classical cesarean section with  subsequent future repeat cesarean sections may be required. After  consent for such, was taken to the operating room. Spinal anesthesia was  accomplished without complications. Her vagina and abdomen were prepped  and draped in usual sterile fashion. A Foley catheter was placed which  drained clear urine throughout the case. After adequate anesthesia was  assured, a transverse incision was approximately 2 cm above the  symphysis pubis. Incision was carried down through the small amount of  subcutaneous tissue to the fascia. Fascia was scored in the midline.  Bleeding encountered was cauterized with a Bovie. Fascial incision was  extended bilaterally with Mayo scissors. The rectus muscles were  partially separated in a transverse fashion using electrosurgical  technique. Excellent hemostasis was maintained. The peritoneum was  entered with Hemostats. Peritoneal incision was extended bilaterally  with the Bovie, taking care to avoid large blood vessels. A  bladder  blade was placed. The uterus was carefully inspected. While the lower  uterine segment was noted to be very thin, it was also very small in  diameter, necessitating a classical incision. The incision was made with  a scalpel, and the placenta was anterior. Amniotomy was performed with  Hemostats. A small amount of clear fluid was noted. The baby was  delivered from a breech presentation. The cord was clamped and cut, the  nostrils were suctioned, and the baby was transferred to the NICU  personnel for routine care. Weight and Apgars were obtained in the NICU.  Cord blood sample was obtained. The placenta was extracted and sent to  pathology. The uterus was left in situ. The uterine incision was closed  with two layers of 0 chromic running locked suture, the second layer  imbricating the first. Excellent hemostasis was noted. The ovaries were  visually normal as were the oviducts. The peritoneum was closed with a 2-  0 Vicryl running nonlocking suture. The fascia was closed with a 0  Vicryl running nonlocking suture. The subcutaneous tissue and rectus  muscles were inspected and noted to  be hemostatic. The subcutaneous  tissue was irrigated, cleaned and dried.  It was then infiltrated with 30 mL of 0.5% Marcaine. A subcuticular  closure was done with 3-0 Vicryl. Steri-Strips were placed. She was  taken to the recovery room in stable condition. The instrument, sponge  and needle counts were correct.      Allie Bossier, MD  Electronically Signed     MCD/MEDQ  D:  08/22/2006  T:  08/22/2006  Job:  [id number]

## 2010-12-31 NOTE — Discharge Summary (Signed)
Meredith Jackson, Meredith Jackson                ACCOUNT NO.:  [id number]   MEDICAL RECORD NO.:  [id number]          PATIENT TYPE:  INP   LOCATION:  9311                          [facility]   PHYSICIAN:  Phil D. Okey Dupre, M.D.     DATE OF BIRTH:  [birth year]   DATE OF ADMISSION:  08/18/2006  DATE OF DISCHARGE:  08/24/2006                               DISCHARGE SUMMARY   REASON FOR ADMISSION:  A 25 year old G4, P1-0-2-0 at 55 weeks, with  history of IUFD and with this current pregnancy, was found to have  severe IUGR with reversed end-diastolic flow and variables on the  nonstress test.   DISCHARGE DIAGNOSES:  1. Status post classical cesarean section for breech presentation in      the setting of fetus with severe intrauterine growth restriction      and reversed end-diastolic flow and oligohydramnios.  2. History of 38-week stillborn.  3. Anemia of pregnancy aggravated by acute blood loss.   LABORATORY DATA:  Admit hemoglobin 11.6, discharge hemoglobin at 10.3.  Blood type O positive.   IMAGING:  Multiple ultrasounds were performed during her  hospitalization.  On admission, ultrasound showed estimated fetal weight  of 630 g, which is less than the 10th percentile, AFI 13.19 cm,  __________ diastolic flow with reversed diastolic flow.   HOSPITAL COURSE:  The patient is a 25 year old G4, P1-0-2-0 at 69 weeks,  that was being very closely in the [facility] for a poor  obstetrical history.  She had a history of an IUFD at 38 weeks and 2  second trimester miscarriages.  On August 14, 2006, ultrasound showed  IUGR; baby weighed less than the 10th percentile, so the patient was  admitted for monitoring.  She had had steroids previously on December 14  and 15.  On the day of admission, the patient's fetal testing revealing  that she had variables in the clinic and during her BPP, she was found  to have reversed flow on the Doppler.  The patient was admitted for  observation.  Maternal  Fetal Medicine was consulted; they recommended  repeating the steroids and then 48 hours after the steroids, they  proceeding with delivery.  Cervidil induction was started and the  infant's presentation was confirmed to be vertex by ultrasound.  However, on the 2nd day of induction, vaginal exam revealed that the  infant was breech and this was confirmed with ultrasound.  A classical  cesarean section was then performed; please see operative note for full  details.  The patient's postoperative course was unremarkable.  She was  ambulating well and tolerating p.o. and found to be stable for  discharge.   DISPOSITION:  Home.   FOLLOWUP:  Follow up in 6 weeks at the health department.   ACTIVITY:  No heavy lifting x6 weeks, nothing per vagina x6 weeks.   DISCHARGE INSTRUCTIONS:  She is counseled that she should never have a  vaginal delivery secondary to having a classical cesarean section.  She  is to return with any signs and symptoms of infection such as elevated  temperature  or foul-smelling vaginal discharge.   DIET:  Regular.   MEDICATIONS:  1. Percocet 5 mg one to two p.o. q.4-6 h.  2. Ibuprofen 600 mg p.o. q.6 h. p.r.n. pain.  3. Colace 100 mg b.i.d.     ______________________________  Marc Morgans. Mayford Knife, M.D.    ______________________________  Javier Glazier. Okey Dupre, M.D.    TLW/MEDQ  D:  08/24/2006  T:  08/24/2006  Job:  [id number]

## 2011-05-20 LAB — POCT URINALYSIS DIP (DEVICE)
Bilirubin Urine: NEGATIVE
Glucose, UA: NEGATIVE mg/dL
Hgb urine dipstick: NEGATIVE
Nitrite: NEGATIVE
Urobilinogen, UA: 0.2 mg/dL (ref 0.0–1.0)

## 2011-05-20 LAB — URINALYSIS, ROUTINE W REFLEX MICROSCOPIC
Glucose, UA: NEGATIVE mg/dL
Nitrite: NEGATIVE
Specific Gravity, Urine: 1.039 — ABNORMAL HIGH (ref 1.005–1.030)
pH: 6 (ref 5.0–8.0)

## 2011-05-20 LAB — CBC
HCT: 33.9 % — ABNORMAL LOW (ref 36.0–46.0)
HCT: 36.3 % (ref 36.0–46.0)
Hemoglobin: 11.2 g/dL — ABNORMAL LOW (ref 12.0–15.0)
MCHC: 33 g/dL (ref 30.0–36.0)
MCV: 80.6 fL (ref 78.0–100.0)
Platelets: 333 10*3/uL (ref 150–400)
RBC: 4.25 MIL/uL (ref 3.87–5.11)
RBC: 4.5 MIL/uL (ref 3.87–5.11)
WBC: 7.7 10*3/uL (ref 4.0–10.5)

## 2011-05-20 LAB — DIFFERENTIAL
Eosinophils Absolute: 0.6 10*3/uL (ref 0.0–0.7)
Eosinophils Relative: 7 % — ABNORMAL HIGH (ref 0–5)
Lymphocytes Relative: 34 % (ref 12–46)
Lymphs Abs: 2.6 10*3/uL (ref 0.7–4.0)
Monocytes Relative: 9 % (ref 3–12)
Neutrophils Relative %: 48 % (ref 43–77)

## 2011-05-20 LAB — POCT PREGNANCY, URINE: Preg Test, Ur: POSITIVE

## 2011-05-20 LAB — BASIC METABOLIC PANEL
Chloride: 108 mEq/L (ref 96–112)
GFR calc Af Amer: >60 mL/min (ref >60)
GFR calc non Af Amer: >60 mL/min (ref >60)
Potassium: 3.6 mEq/L (ref 3.5–5.1)

## 2011-05-20 LAB — WET PREP, GENITAL
Clue Cells Wet Prep HPF POC: NONE SEEN
WBC, Wet Prep HPF POC: NONE SEEN
Yeast Wet Prep HPF POC: NONE SEEN

## 2011-05-20 LAB — URINE CULTURE

## 2011-05-20 LAB — HCG, QUANTITATIVE, PREGNANCY
hCG, Beta Chain, Quant, S: 1494 m[IU]/mL — ABNORMAL HIGH (ref <5)
hCG, Beta Chain, Quant, S: 3435 m[IU]/mL — ABNORMAL HIGH (ref <5)
hCG, Beta Chain, Quant, S: 8458 m[IU]/mL — ABNORMAL HIGH (ref <5)

## 2011-05-20 LAB — GC/CHLAMYDIA PROBE AMP, GENITAL
Chlamydia, DNA Probe: NEGATIVE
GC Probe Amp, Genital: NEGATIVE

## 2011-05-24 LAB — POCT PREGNANCY, URINE: Operator id: 116391

## 2011-09-07 ENCOUNTER — Emergency Department (INDEPENDENT_AMBULATORY_CARE_PROVIDER_SITE_OTHER)
Admission: EM | Admit: 2011-09-07 | Discharge: 2011-09-07 | Disposition: A | Discharge status: Nursing Home (Includes ICF) | Payer: Self-pay | Source: Home / Self Care | Attending: Emergency Medicine | Admitting: Emergency Medicine

## 2011-09-07 ENCOUNTER — Encounter (HOSPITAL_COMMUNITY): Payer: Self-pay | Admitting: *Deleted

## 2011-09-07 ENCOUNTER — Encounter (HOSPITAL_COMMUNITY): Payer: Self-pay | Admitting: Emergency Medicine

## 2011-09-07 ENCOUNTER — Emergency Department (HOSPITAL_COMMUNITY)
Admission: EM | Admit: 2011-09-07 | Discharge: 2011-09-08 | Disposition: A | Discharge status: Home / Self Care | Payer: Medicaid Other | Attending: Emergency Medicine | Admitting: Emergency Medicine

## 2011-09-07 DIAGNOSIS — F17200 Nicotine dependence, unspecified, uncomplicated: Secondary | ICD-10-CM | POA: Insufficient documentation

## 2011-09-07 DIAGNOSIS — R110 Nausea: Secondary | ICD-10-CM | POA: Insufficient documentation

## 2011-09-07 DIAGNOSIS — R109 Unspecified abdominal pain: Secondary | ICD-10-CM | POA: Insufficient documentation

## 2011-09-07 DIAGNOSIS — N390 Urinary tract infection, site not specified: Secondary | ICD-10-CM

## 2011-09-07 DIAGNOSIS — N898 Other specified noninflammatory disorders of vagina: Secondary | ICD-10-CM | POA: Insufficient documentation

## 2011-09-07 LAB — URINALYSIS, ROUTINE W REFLEX MICROSCOPIC
Ketones, ur: 40 mg/dL — AB
Nitrite: POSITIVE — AB
Protein, ur: 100 mg/dL — AB
pH: 5 (ref 5.0–8.0)

## 2011-09-07 LAB — COMPREHENSIVE METABOLIC PANEL
Albumin: 4.2 g/dL (ref 3.5–5.2)
BUN: 6 mg/dL (ref 6–23)
Calcium: 9.8 mg/dL (ref 8.4–10.5)
Chloride: 102 mEq/L (ref 96–112)
Creatinine, Ser: 0.71 mg/dL (ref 0.50–1.10)
Total Bilirubin: 0.6 mg/dL (ref 0.3–1.2)

## 2011-09-07 LAB — CBC
HCT: 40.9 % (ref 36.0–46.0)
Hemoglobin: 13.6 g/dL (ref 12.0–15.0)
MCH: 27.9 pg (ref 26.0–34.0)
MCHC: 33.3 g/dL (ref 30.0–36.0)
MCV: 83.8 fL (ref 78.0–100.0)
RDW: 12.2 % (ref 11.5–15.5)

## 2011-09-07 LAB — DIFFERENTIAL
Basophils Absolute: 0 10*3/uL (ref 0.0–0.1)
Basophils Relative: 0 % (ref 0–1)
Eosinophils Absolute: 0.3 10*3/uL (ref 0.0–0.7)
Eosinophils Relative: 3 % (ref 0–5)
Monocytes Absolute: 0.8 10*3/uL (ref 0.1–1.0)
Monocytes Relative: 8 % (ref 3–12)
Neutro Abs: 7 10*3/uL (ref 1.7–7.7)

## 2011-09-07 LAB — URINE MICROSCOPIC-ADD ON

## 2011-09-07 LAB — WET PREP, GENITAL
Trich, Wet Prep: NONE SEEN
WBC, Wet Prep HPF POC: NONE SEEN
Yeast Wet Prep HPF POC: NONE SEEN

## 2011-09-07 LAB — POCT URINALYSIS DIP (DEVICE)
Protein, ur: 30 mg/dL — AB
Specific Gravity, Urine: 1.025 (ref 1.005–1.030)
Urobilinogen, UA: 1 mg/dL (ref 0.0–1.0)
pH: 6 (ref 5.0–8.0)

## 2011-09-07 LAB — LIPASE, BLOOD: Lipase: 14 U/L (ref 11–59)

## 2011-09-07 LAB — PREGNANCY, URINE: Preg Test, Ur: NEGATIVE

## 2011-09-07 MED ORDER — SODIUM CHLORIDE 0.9 % IV SOLN
999.0000 mL | INTRAVENOUS | Status: DC
  Administered 2011-09-07: 999 mL via INTRAVENOUS

## 2011-09-07 MED ORDER — ONDANSETRON HCL 4 MG/2ML IJ SOLN
4.0000 mg | Freq: Once | INTRAMUSCULAR | Status: AC
  Administered 2011-09-07: 4 mg via INTRAVENOUS
  Filled 2011-09-07: qty 2

## 2011-09-07 MED ORDER — HYDROMORPHONE HCL PF 1 MG/ML IJ SOLN
1.0000 mg | Freq: Once | INTRAMUSCULAR | Status: AC
  Administered 2011-09-07: 1 mg via INTRAVENOUS
  Filled 2011-09-07: qty 1

## 2011-09-07 MED ORDER — SODIUM CHLORIDE 0.9 % IV BOLUS (SEPSIS)
1000.0000 mL | Freq: Once | INTRAVENOUS | Status: AC
  Administered 2011-09-07: 1000 mL via INTRAVENOUS

## 2011-09-07 NOTE — ED Notes (Signed)
Dr. Bebe Shaggy notified that pelvic cart has been set up at bedside.

## 2011-09-07 NOTE — ED Notes (Signed)
Pt. Reports being unable to void.

## 2011-09-07 NOTE — ED Notes (Signed)
Family now present at bedside. 

## 2011-09-07 NOTE — ED Notes (Signed)
Patient moved from stretcher to exam room 06; patient currently sitting up in bed; no respiratory or acute distress noted.  Patient updated on plan of care; informed patient that we need a urine sample.  Patient assisted to restroom.  Patient has no other questions or concerns at this time; will continue to monitor.

## 2011-09-07 NOTE — ED Provider Notes (Signed)
Pt seen at the UC.  Sent down for severe abd pain in ruq and rlq.  Pt also with vaginal bleeding.  TTP RUQ  And RLQ.  Will proceed with lab evaluation.  Pain meds ordered.  MSE initiated.  6:35 PM   Celene Kras, MD 09/07/11 [phone]

## 2011-09-07 NOTE — ED Notes (Signed)
Pt is here from [facility] for further evaluation of RUQ pain that began around 2am.  Pt appears in pain, pain increases with palpation.  Pt has had nausea with this.  No vomiting, no diarrhea, no pain or burning with urination.

## 2011-09-07 NOTE — ED Notes (Signed)
Patient currently sitting up in bed; no respiratory or acute distress noted.  Patient updated on plan of care; informed patient that we are currently waiting on the results of the wet prep to come back; patient has no other questions or concerns at this time; will continue to monitor.

## 2011-09-07 NOTE — ED Provider Notes (Signed)
History     CSN: [id number]  Arrival date & time 09/07/11  1425   First MD Initiated Contact with Patient 09/07/11 1443      Chief Complaint  Patient presents with  . Flank Pain    (Consider location/radiation/quality/duration/timing/severity/associated sxs/prior treatment) HPI Comments: Denym presents for evaluation for abdominal pain that started last night at 1 am. She denies any injury and reports sudden onset. The pain has persisted since that time, with nausea, but no vomiting. She reports that she cannot "get comfortable." She denies any urinary symptoms. She also reports that she is having vaginal bleeding. She denies any fever. She has not eaten anything today, but is tolerating PO.Marland Kitchen She is very tender to palpation in her RUQ and RLQ.   Patient is a 26 y.o. female presenting with abdominal pain. The history is provided by the patient.  Abdominal Pain The primary symptoms of the illness include abdominal pain, nausea and vaginal bleeding. The primary symptoms of the illness do not include shortness of breath, vomiting or dysuria. The current episode started 13 to 24 hours ago. The onset of the illness was sudden. The problem has not changed since onset. The abdominal pain began 13 to24 hours ago. The pain came on suddenly. The abdominal pain is located in the RUQ, RLQ and right flank. The abdominal pain does not radiate. The abdominal pain is relieved by nothing.  Nausea began yesterday. The nausea is exacerbated by activity.  The patient states that she believes she is currently not pregnant.    History reviewed. No pertinent past medical history.  History reviewed. No pertinent past surgical history.  History reviewed. No pertinent family history.  History  Substance Use Topics  . Smoking status: Current Everyday Smoker -- 1.0 packs/day    Types: Cigarettes  . Smokeless tobacco: Not on file  . Alcohol Use: Yes     Occasional    OB History    Grav Para Term Preterm  Abortions TAB SAB Ect Mult Living                  Review of Systems  Constitutional: Negative.   HENT: Negative.   Eyes: Negative.   Respiratory: Negative.  Negative for shortness of breath.   Cardiovascular: Negative.   Gastrointestinal: Positive for nausea and abdominal pain. Negative for vomiting.  Genitourinary: Positive for vaginal bleeding. Negative for dysuria.  Musculoskeletal: Negative.   Skin: Negative.   Neurological: Negative.     Allergies  Review of patient's allergies indicates no known allergies.  Home Medications  No current outpatient prescriptions on file.  BP 111/77  Pulse 85  Temp(Src) 99.3 F (37.4 C) (Oral)  Resp 20  SpO2 100%  LMP 09/07/2011  Physical Exam  Nursing note and vitals reviewed. Constitutional: She is oriented to person, place, and time. She appears well-developed and well-nourished.  HENT:  Head: Normocephalic and atraumatic.  Eyes: EOM are normal.  Neck: Normal range of motion.  Pulmonary/Chest: Effort normal.  Abdominal: Soft. Normal appearance and bowel sounds are normal. There is tenderness in the right upper quadrant and right lower quadrant. There is tenderness at McBurney's point and positive Murphy's sign.  Musculoskeletal: Normal range of motion.  Neurological: She is alert and oriented to person, place, and time.  Skin: Skin is warm and dry.  Psychiatric: Her behavior is normal.    ED Course  Procedures (including critical care time)  Labs Reviewed  POCT URINALYSIS DIP (DEVICE) - Abnormal; Notable for the following:  Bilirubin Urine SMALL (*)    Ketones, ur TRACE (*)    Hgb urine dipstick LARGE (*)    Protein, ur 30 (*)    All other components within normal limits  POCT PREGNANCY, URINE   No results found.   1. Abdominal pain       MDM  Labs reviewed; transferred to ED for further evaluation of abdominal pain, suspect gall bladder pathology vs. appendix        Richardo Priest, MD 09/07/11  1722

## 2011-09-07 NOTE — ED Notes (Signed)
Pt having severe pain in right flank since last night and she also feels tight. States there is no position that gives relief, it hurts to breath, walk everything. She says she has excessive vaginal bleeding more than her period.

## 2011-09-07 NOTE — ED Provider Notes (Addendum)
History     CSN: [id number]  Arrival date & time 09/07/11  1733   First MD Initiated Contact with Patient 09/07/11 2200  Pt seen with medical student, I performed history/physical/documentation        Chief Complaint  Patient presents with  . Abdominal Pain    sent from [facility] for evaluation of appy vs. chole     Patient is a 26 y.o. female presenting with abdominal pain. The history is provided by the patient.  Abdominal Pain The primary symptoms of the illness include abdominal pain, nausea and vaginal bleeding. The primary symptoms of the illness do not include fever. The current episode started 13 to 24 hours ago. The onset of the illness was sudden. The problem has been gradually worsening.  The patient states that she believes she is currently not pregnant. Additional symptoms associated with the illness include chills. Symptoms associated with the illness do not include back pain.  she reports onset of right flank/abd pain last night with associated vaginal bleeding No dysuria She reports never having this pain previously No CP is reported  PMh - none  Past Surgical History  Procedure Date  . Cesarean section     x2    No family history on file.  History  Substance Use Topics  . Smoking status: Current Everyday Smoker -- 1.0 packs/day    Types: Cigarettes  . Smokeless tobacco: Not on file  . Alcohol Use: Yes     Occasional    OB History    Grav Para Term Preterm Abortions TAB SAB Ect Mult Living                  Review of Systems  Constitutional: Positive for chills. Negative for fever.  Gastrointestinal: Positive for nausea and abdominal pain.  Genitourinary: Positive for vaginal bleeding.  Musculoskeletal: Negative for back pain.  All other systems reviewed and are negative.    Allergies  Review of patient's allergies indicates no known allergies.  Home Medications  No current outpatient prescriptions on file.  BP 118/68  Pulse 76  Temp(Src)  98 F (36.7 C) (Oral)  Resp 18  SpO2 100%  LMP 09/07/2011  Physical Exam CONSTITUTIONAL: Well developed/well nourished HEAD AND FACE: Normocephalic/atraumatic EYES: EOMI/PERRL ENMT: Mucous membranes moist NECK: supple no meningeal signs SPINE:entire spine nontender CV: S1/S2 noted, no murmurs/rubs/gallops noted LUNGS: Lungs are clear to auscultation bilaterally, no apparent distress ABDOMEN: soft, she is tender in RUQ and RLQ, tenderness is moderate, no rebound/guarding +BS noted.   GU:no cva tenderness, chaperone present - blood noted in vault but no active bleeding, no cmt, no adnexal tenderness. No adnexal mass.   NEURO: Pt is awake/alert, moves all extremitiesx4 EXTREMITIES: pulses normal, full ROM SKIN: warm, color normal PSYCH: no abnormalities of mood noted  ED Course  Procedures   Labs Reviewed  URINALYSIS, ROUTINE W REFLEX MICROSCOPIC - Abnormal; Notable for the following:    Color, Urine RED (*) BIOCHEMICALS MAY BE AFFECTED BY COLOR   APPearance TURBID (*)    Hgb urine dipstick LARGE (*)    Bilirubin Urine MODERATE (*)    Ketones, ur 40 (*)    Protein, ur 100 (*)    Nitrite POSITIVE (*)    Leukocytes, UA MODERATE (*)    All other components within normal limits  URINE MICROSCOPIC-ADD ON - Abnormal; Notable for the following:    Squamous Epithelial / LPF FEW (*)    Bacteria, UA MANY (*)    All  other components within normal limits  WET PREP, GENITAL - Abnormal; Notable for the following:    Clue Cells, Wet Prep FEW (*)    All other components within normal limits  CBC  DIFFERENTIAL  COMPREHENSIVE METABOLIC PANEL  LIPASE, BLOOD  PREGNANCY, URINE  HCG, SERUM, QUALITATIVE  GC/CHLAMYDIA PROBE AMP, GENITAL   11:46 PM Pt with flank/abd pain  Has RUQ/RLQ tenderness will start CT imaging She is not pregnant Pt stable at this time  12:22 AM D/w dr Theodoro Kalata, who will f/u on ct imaging If negative, will need reassessment, pt also has uti   MDM  Nursing notes  reviewed and considered in documentation Previous records reviewed and considered All labs/vitals reviewed and considered         Joya Gaskins, MD 09/08/11 0022  Results for orders placed during the hospital encounter of 09/07/11  CBC      Component Value Range   WBC 10.2  4.0 - 10.5 (K/uL)   RBC 4.88  3.87 - 5.11 (MIL/uL)   Hemoglobin 13.6  12.0 - 15.0 (g/dL)   HCT 84.6  96.2 - 95.2 (%)   MCV 83.8  78.0 - 100.0 (fL)   MCH 27.9  26.0 - 34.0 (pg)   MCHC 33.3  30.0 - 36.0 (g/dL)   RDW 84.1  32.4 - 40.1 (%)   Platelets 302  150 - 400 (K/uL)  DIFFERENTIAL      Component Value Range   Neutrophils Relative 69  43 - 77 (%)   Neutro Abs 7.0  1.7 - 7.7 (K/uL)   Lymphocytes Relative 21  12 - 46 (%)   Lymphs Abs 2.1  0.7 - 4.0 (K/uL)   Monocytes Relative 8  3 - 12 (%)   Monocytes Absolute 0.8  0.1 - 1.0 (K/uL)   Eosinophils Relative 3  0 - 5 (%)   Eosinophils Absolute 0.3  0.0 - 0.7 (K/uL)   Basophils Relative 0  0 - 1 (%)   Basophils Absolute 0.0  0.0 - 0.1 (K/uL)  COMPREHENSIVE METABOLIC PANEL      Component Value Range   Sodium 137  135 - 145 (mEq/L)   Potassium 3.6  3.5 - 5.1 (mEq/L)   Chloride 102  96 - 112 (mEq/L)   CO2 26  19 - 32 (mEq/L)   Glucose, Bld 99  70 - 99 (mg/dL)   BUN 6  6 - 23 (mg/dL)   Creatinine, Ser 0.27  0.50 - 1.10 (mg/dL)   Calcium 9.8  8.4 - 25.3 (mg/dL)   Total Protein 8.1  6.0 - 8.3 (g/dL)   Albumin 4.2  3.5 - 5.2 (g/dL)   AST 19  0 - 37 (U/L)   ALT 10  0 - 35 (U/L)   Alkaline Phosphatase 63  39 - 117 (U/L)   Total Bilirubin 0.6  0.3 - 1.2 (mg/dL)   GFR calc non Af Amer >90  >90 (mL/min)   GFR calc Af Amer >90  >90 (mL/min)  LIPASE, BLOOD      Component Value Range   Lipase 14  11 - 59 (U/L)  URINALYSIS, ROUTINE W REFLEX MICROSCOPIC      Component Value Range   Color, Urine RED (*) YELLOW    APPearance TURBID (*) CLEAR    Specific Gravity, Urine 1.026  1.005 - 1.030    pH 5.0  5.0 - 8.0    Glucose, UA NEGATIVE  NEGATIVE (mg/dL)    Hgb urine dipstick LARGE (*) NEGATIVE  Bilirubin Urine MODERATE (*) NEGATIVE    Ketones, ur 40 (*) NEGATIVE (mg/dL)   Protein, ur 696 (*) NEGATIVE (mg/dL)   Urobilinogen, UA 1.0  0.0 - 1.0 (mg/dL)   Nitrite POSITIVE (*) NEGATIVE    Leukocytes, UA MODERATE (*) NEGATIVE   PREGNANCY, URINE      Component Value Range   Preg Test, Ur NEGATIVE    HCG, SERUM, QUALITATIVE      Component Value Range   Preg, Serum NEGATIVE  NEGATIVE   URINE MICROSCOPIC-ADD ON      Component Value Range   Squamous Epithelial / LPF FEW (*) RARE    WBC, UA 7-10  <3 (WBC/hpf)   RBC / HPF TOO NUMEROUS TO COUNT  <3 (RBC/hpf)   Bacteria, UA MANY (*) RARE   WET PREP, GENITAL      Component Value Range   Yeast, Wet Prep NONE SEEN  NONE SEEN    Trich, Wet Prep NONE SEEN  NONE SEEN    Clue Cells, Wet Prep FEW (*) NONE SEEN    WBC, Wet Prep HPF POC NONE SEEN  NONE SEEN    US Abdomen Complete  09/08/2011  *RADIOLOGY REPORT*  Clinical Data:  Right upper quadrant abdominal pain.  ABDOMINAL ULTRASOUND COMPLETE  Comparison:  CT of the abdomen and pelvis performed earlier today at 02:57 a.m.  Findings:  Gallbladder:  The gallbladder is normal in appearance, without evidence for gallstones, gallbladder wall thickening or pericholecystic fluid.  No ultrasonographic Murphy's sign is elicited.  Common Bile Duct:  0.4 cm in diameter; within normal limits in caliber.  Liver:  Normal parenchymal echogenicity and echotexture; no focal lesions identified.  Limited Doppler evaluation demonstrates normal blood flow within the liver.  IVC:  Unremarkable in appearance.  Pancreas:  Although the pancreas is difficult to visualize in its entirety due to overlying bowel gas, no focal pancreatic abnormality is identified.  Spleen:  8.9 cm in length; within normal limits in size and echotexture.  Right kidney:  10.6 cm in length; normal in size, configuration and parenchymal echogenicity.  No evidence of mass or hydronephrosis.  Left kidney:  11.6 cm  in length; normal in size, configuration and parenchymal echogenicity.  No evidence of mass or hydronephrosis.  Abdominal Aorta:  Normal in caliber; no aneurysm identified.  IMPRESSION: Unremarkable abdominal ultrasound.  Original Report Authenticated By: Tonia Ghent, M.D.   Ct Abdomen Pelvis W Contrast  09/08/2011  *RADIOLOGY REPORT*  Clinical Data: Severe right flank pain and worsening vaginal bleeding.  CT ABDOMEN AND PELVIS WITH CONTRAST  Technique:  Multidetector CT imaging of the abdomen and pelvis was performed following the standard protocol during bolus administration of intravenous contrast.  Contrast: 100 mL of Omnipaque 300 IV contrast  Comparison: Pelvic ultrasound performed 11/11/2009  Findings: Minimal right basilar airspace opacity may reflect atelectasis or possibly very mild pneumonia.  Periportal edema and trace fluid about the gallbladder are thought to reflect volume repletion.  The liver is otherwise unremarkable in appearance.  The spleen is within normal limits.  The gallbladder is otherwise grossly unremarkable.  The pancreas and adrenal glands are within normal limits.  The kidneys are unremarkable in appearance.  There is no evidence of hydronephrosis.  No renal or ureteral stones are seen.  No perinephric stranding is appreciated.  No free fluid is identified.  The small bowel is unremarkable in appearance.  The stomach is filled with contrast and air, and is within normal limits.  No acute vascular abnormalities  are seen.  The appendix is normal in caliber and contains air, without evidence for appendicitis.  Contrast progresses to the level of the mid transverse colon; the colon is otherwise largely filled with air and grossly unremarkable in appearance.  There is minimal soft tissue inflammation noted along the inferior left paracolic gutter, of uncertain significance.  The bladder is mildly distended; apparent soft tissue inflammation about the bladder may reflect cystitis, given  urinalysis results. There is mildly irregular contour to the cervix; this is of uncertain significance.  The ovaries appear relatively symmetric; no suspicious adnexal masses are seen.  No inguinal lymphadenopathy is seen.  No acute osseous abnormalities are identified.  IMPRESSION:  1.  Apparent soft tissue inflammation about the bladder may reflect cystitis. 2.  Mildly irregular contour to the cervix; this is of uncertain significance.  3.  Minimal soft tissue inflammation about the inferior left paracolic gutter, of uncertain significance.  The colon is mildly distended with air and unremarkable in appearance. 4.  Minimal right basilar airspace opacity may reflect atelectasis or possibly very mild pneumonia.  Original Report Authenticated By: Tonia Ghent, M.D.    Patient evaluated and CT results shared with patient as above. She was given IV Rocephin for possible pneumonia and possible cystitis. She still complaining of right upper quadrant pain and tenderness. No shortness of breath or tachycardia to suggest PE. Perc negative. No peritonitis on exam and no Murphy's sign. Ultrasound obtained and reviewed as above. Continue pain control. Normal gallbladder on ultrasound noted. She complains of hematuria which may be cystitis. Plan outpatient antibiotics and pain medications and primary care followup as needed. Patient stable for discharge home.  Sunnie Nielsen, MD 09/08/11 [phone]

## 2011-09-07 NOTE — ED Notes (Signed)
Dr. Bebe Shaggy and nurse tech at bedside with pelvic cart.

## 2011-09-08 ENCOUNTER — Emergency Department (HOSPITAL_COMMUNITY): Payer: Medicaid Other

## 2011-09-08 ENCOUNTER — Encounter (HOSPITAL_COMMUNITY): Payer: Self-pay | Admitting: Radiology

## 2011-09-08 MED ORDER — CEFPODOXIME PROXETIL 200 MG PO TABS: 200.0000 mg | ORAL_TABLET | Freq: Two times a day (BID) | ORAL | Status: AC

## 2011-09-08 MED ORDER — HYDROMORPHONE HCL PF 1 MG/ML IJ SOLN
1.0000 mg | Freq: Once | INTRAMUSCULAR | Status: AC
  Administered 2011-09-08: 1 mg via INTRAVENOUS
  Filled 2011-09-08: qty 1

## 2011-09-08 MED ORDER — IOHEXOL 300 MG/ML  SOLN
40.0000 mL | Freq: Once | INTRAMUSCULAR | Status: AC | PRN
  Administered 2011-09-08: 40 mL via ORAL

## 2011-09-08 MED ORDER — DEXTROSE 5 % IV SOLN
1.0000 g | Freq: Once | INTRAVENOUS | Status: AC
  Administered 2011-09-08: 1 g via INTRAVENOUS
  Filled 2011-09-08: qty 10

## 2011-09-08 MED ORDER — PROMETHAZINE HCL 25 MG PO TABS: 25.0000 mg | ORAL_TABLET | Freq: Four times a day (QID) | ORAL | Status: AC | PRN

## 2011-09-08 MED ORDER — HYDROCODONE-ACETAMINOPHEN 5-500 MG PO TABS: 1.0000 | ORAL_TABLET | Freq: Four times a day (QID) | ORAL | Status: AC | PRN

## 2011-09-08 MED ORDER — IOHEXOL 300 MG/ML  SOLN
100.0000 mL | Freq: Once | INTRAMUSCULAR | Status: AC | PRN
  Administered 2011-09-08: 100 mL via INTRAVENOUS

## 2011-09-08 NOTE — ED Notes (Signed)
Pt report received from Onalee Hua, [place].  Pt has already received her discharge papers and rx.  She states that she was waiting until 0800 hrs because she had driven herself to the ER and was waiting for the pain medication to " wear off".  No verbal complaints at this time.

## 2011-09-08 NOTE — ED Notes (Signed)
Received pt. Via stretcher from [place], pt. Alert and oriented, pt. Awaiting CT scan, pt. Drinking contrast, NAD noted

## 2011-09-08 NOTE — ED Notes (Signed)
Gave report to Onalee Hua, [employer].  Patient transported from exam room 06 to exam room 32 while pending for CT.  Patient almost finish drinking second cup of contrast.  Transport OKed by Dr. Dierdre Highman.

## 2011-09-08 NOTE — ED Notes (Signed)
Discharge instructions given pt. resting until 0800 per Dr. Theodoro Kalata, pt. Alert and oriented, NAD noted

## 2011-09-08 NOTE — ED Notes (Signed)
Sanitary napkins provide to the pt for reported  active vaginal bleeding.

## 2011-09-08 NOTE — ED Notes (Signed)
[facility] called to say pt states vantin is too expensive; keflex 500mg  po tid x7days ordered by dr Harrold Donath pickering; no refills; [facility] at [phone].

## 2011-09-08 NOTE — ED Notes (Signed)
Patient currently sitting up in bed; no respiratory or acute distress noted.  Patient requesting more pain medication; Dr. Bebe Shaggy notified.  Patient has no other questions or concerns at this time; patient has one more contrast/water cup to drink.  Will continue to monitor.

## 2011-09-08 NOTE — ED Notes (Signed)
Patient ambulatory to restroom; patient given pads.

## 2011-09-09 ENCOUNTER — Encounter (HOSPITAL_COMMUNITY): Payer: Self-pay | Admitting: Emergency Medicine

## 2011-09-09 LAB — GC/CHLAMYDIA PROBE AMP, GENITAL: Chlamydia, DNA Probe: NEGATIVE

## 2011-09-10 NOTE — ED Notes (Addendum)
+   Gonorrhea. Treated with Rocephin. Per protocol MD. [facility] faxed.

## 2011-09-11 NOTE — ED Notes (Signed)
Attempted to call. Phone # not valid.

## 2011-09-12 NOTE — ED Notes (Signed)
Unable to contact via phone letter sent to Epic address. 

## 2012-01-09 ENCOUNTER — Emergency Department (HOSPITAL_COMMUNITY)
Admission: EM | Admit: 2012-01-09 | Discharge: 2012-01-09 | Disposition: A | Discharge status: Home / Self Care | Payer: Medicaid Other | Source: Home / Self Care

## 2012-01-09 ENCOUNTER — Encounter (HOSPITAL_COMMUNITY): Payer: Self-pay | Admitting: *Deleted

## 2012-01-09 DIAGNOSIS — H109 Unspecified conjunctivitis: Secondary | ICD-10-CM

## 2012-01-09 MED ORDER — POLYMYXIN B-TRIMETHOPRIM 10000-0.1 UNIT/ML-% OP SOLN: 1.0000 [drp] | OPHTHALMIC | Status: AC

## 2012-01-09 NOTE — Discharge Instructions (Signed)
Thank you for coming in today.  Conjunctivitis Conjunctivitis is commonly called "pink eye." Conjunctivitis can be caused by bacterial or viral infection, allergies, or injuries. There is usually redness of the lining of the eye, itching, discomfort, and sometimes discharge. There may be deposits of matter along the eyelids. A viral infection usually causes a watery discharge, while a bacterial infection causes a yellowish, thick discharge. Pink eye is very contagious and spreads by direct contact. You may be given antibiotic eyedrops as part of your treatment. Before using your eye medicine, remove all drainage from the eye by washing gently with warm water and cotton balls. Continue to use the medication until you have awakened 2 mornings in a row without discharge from the eye. Do not rub your eye. This increases the irritation and helps spread infection. Use separate towels from other household members. Wash your hands with soap and water before and after touching your eyes. Use cold compresses to reduce pain and sunglasses to relieve irritation from light. Do not wear contact lenses or wear eye makeup until the infection is gone. SEEK MEDICAL CARE IF:   Your symptoms are not better after 3 days of treatment.   You have increased pain or trouble seeing.   The outer eyelids become very red or swollen.  Document Released: 09/08/2004 Document Revised: 07/21/2011 Document Reviewed: 08/01/2005 [facility] Patient Information 2012 [place], [place].

## 2012-01-09 NOTE — ED Provider Notes (Signed)
[facility] is a 26 y.o. female who presents to Urgent Care today for conjunctivitis. Patient developed right eye swelling one day ago. She's had 3 weeks of cough and nasal discharge. She cannot think of any eye injury and denies any change in visual acuity.  Her youngest daughter also has conjunctivitis.  She denies any trouble breathing fevers chills nausea vomiting or diarrhea.  She has used DayQuil, which has not helped much.   PMH reviewed. Otherwise healthy History  Substance Use Topics  . Smoking status: Current Everyday Smoker -- 0.5 packs/day    Types: Cigarettes  . Smokeless tobacco: Not on file  . Alcohol Use: Yes     Occasional   ROS as above Medications reviewed. No current facility-administered medications for this encounter.   Current Outpatient Prescriptions  Medication Sig Dispense Refill  . trimethoprim-polymyxin b (POLYTRIM) ophthalmic solution Place 1 drop into the right eye every 4 (four) hours.  10 mL  0    Exam:  BP 93/56  Pulse 90  Temp(Src) 97.9 F (36.6 C) (Oral)  Resp 16  SpO2 100% Gen: Well NAD HEENT: EOMI,  MMM, right tympanic membrane is retracted without erythema Right eye has conjunctival injection and yellow discharge Lungs: CTABL Nl WOB Heart: RRR no MRG Exts: Non edematous BL  LE, warm and well perfused.   No results found for this or any previous visit (from the past 24 hour(s)). No results found.  Assessment and Plan: 26 y.o. female with conjunctivitis.  Viral versus bacterial.  Plan to treat for bacterial with Polytrim eyedrops. Recommend followup with primary care doctor in one week if not improvement or sooner if worsening. Handout provided. Patient expresses understanding.     Rodolph Bong, MD 01/09/12 2114

## 2012-01-09 NOTE — ED Notes (Signed)
C/O cough x 3 wks without relief from OTC cough med.  Today woke up with swollen, pink, painful right eye with drainage.

## 2012-01-10 NOTE — ED Provider Notes (Signed)
Medical screening examination/treatment/procedure(s) were performed by PGY-3 FM resident and as supervising physician I was immediately available for consultation/collaboration.   Lynnsey Barbara Moreno-Coll, MD   Rastus Borton Moreno-Coll, MD 01/10/12 1021 

## 2012-08-26 ENCOUNTER — Emergency Department (HOSPITAL_COMMUNITY)
Admission: EM | Admit: 2012-08-26 | Discharge: 2012-08-26 | Disposition: A | Discharge status: Home / Self Care | Payer: Medicaid Other | Source: Home / Self Care | Attending: Emergency Medicine | Admitting: Emergency Medicine

## 2012-08-26 ENCOUNTER — Encounter (HOSPITAL_COMMUNITY): Payer: Self-pay | Admitting: *Deleted

## 2012-08-26 ENCOUNTER — Other Ambulatory Visit (HOSPITAL_COMMUNITY)
Admission: RE | Admit: 2012-08-26 | Discharge: 2012-08-26 | Disposition: A | Discharge status: Home / Self Care | Payer: Medicaid Other | Source: Ambulatory Visit | Attending: Emergency Medicine | Admitting: Emergency Medicine

## 2012-08-26 DIAGNOSIS — N760 Acute vaginitis: Secondary | ICD-10-CM | POA: Insufficient documentation

## 2012-08-26 DIAGNOSIS — N898 Other specified noninflammatory disorders of vagina: Secondary | ICD-10-CM

## 2012-08-26 DIAGNOSIS — Z113 Encounter for screening for infections with a predominantly sexual mode of transmission: Secondary | ICD-10-CM | POA: Insufficient documentation

## 2012-08-26 DIAGNOSIS — N939 Abnormal uterine and vaginal bleeding, unspecified: Secondary | ICD-10-CM

## 2012-08-26 LAB — POCT URINALYSIS DIP (DEVICE)
Leukocytes, UA: NEGATIVE
Protein, ur: NEGATIVE mg/dL
Urobilinogen, UA: 0.2 mg/dL (ref 0.0–1.0)

## 2012-08-26 LAB — POCT PREGNANCY, URINE: Preg Test, Ur: NEGATIVE

## 2012-08-26 LAB — POCT I-STAT, CHEM 8
Calcium, Ion: 1.26 mmol/L — ABNORMAL HIGH (ref 1.12–1.23)
HCT: 43 % (ref 36.0–46.0)
Hemoglobin: 14.6 g/dL (ref 12.0–15.0)
TCO2: 29 mmol/L (ref 0–100)

## 2012-08-26 MED ORDER — IBUPROFEN 800 MG PO TABS
800.0000 mg | ORAL_TABLET | Freq: Once | ORAL | Status: AC
  Administered 2012-08-26: 800 mg via ORAL

## 2012-08-26 MED ORDER — IBUPROFEN 600 MG PO TABS: 600.0000 mg | ORAL_TABLET | Freq: Four times a day (QID) | ORAL | Status: DC | PRN

## 2012-08-26 MED ORDER — IBUPROFEN 800 MG PO TABS
ORAL_TABLET | ORAL | Status: AC
  Filled 2012-08-26: qty 1

## 2012-08-26 NOTE — ED Notes (Signed)
Waiting on discharge papers 

## 2012-08-26 NOTE — ED Provider Notes (Signed)
Medical screening examination/treatment/procedure(s) were performed by non-physician practitioner and as supervising physician I was immediately available for consultation/collaboration.  Leslee Home, M.D.   Reuben Likes, MD 08/26/12 [phone]

## 2012-08-26 NOTE — ED Notes (Signed)
Patient states that she has been having irregular menstrual cycles lasting 2 or more weeks with 2-3 month periods between mensis. Patient states the cramping is worse than it has ever been during this cycle.

## 2012-08-26 NOTE — ED Provider Notes (Addendum)
History     CSN: [id number]  Arrival date & time 08/26/12  1520   First MD Initiated Contact with Patient 08/26/12 1755      Chief Complaint  Patient presents with  . Menstrual Problem    (Consider location/radiation/quality/duration/timing/severity/associated sxs/prior treatment) Patient is a 27 y.o. female presenting with vaginal bleeding. The history is provided by the patient.  Vaginal Bleeding This is a new problem. The current episode started more than 1 week ago (3 weeks ago). The problem occurs daily. The problem has not changed since onset.Associated symptoms include abdominal pain.  Female complains of lower abdominal cramping radiating into back associated with abnormal vaginal bleeding since mid December.  Describes bleeding as heavy using 4-5 pads daily.  -nausea, +dyspaurenia, +fatigue; no fever, vaginal discharge, vomiting, diarrhea, UTI symptoms.  Sexually active, denies hx of STD's, does not use condoms, no change in partners.  Last unprotected intercourse 5 days ago.  Denies history of known exposure to STD or symptoms in partner.   History reviewed. No pertinent past medical history.  Past Surgical History  Procedure Date  . Cesarean section     x2    No family history on file.  History  Substance Use Topics  . Smoking status: Current Every Day Smoker -- 0.5 packs/day    Types: Cigarettes  . Smokeless tobacco: Not on file  . Alcohol Use: Yes     Comment: Occasional    OB History    Grav Para Term Preterm Abortions TAB SAB Ect Mult Living                  Review of Systems  Constitutional: Positive for fatigue.  Gastrointestinal: Positive for abdominal pain.  Genitourinary: Positive for vaginal bleeding.  All other systems reviewed and are negative.    Allergies  Review of patient's allergies indicates no known allergies.  Home Medications   Current Outpatient Rx  Name  Route  Sig  Dispense  Refill  . IBUPROFEN 600 MG PO TABS   Oral  Take 1 tablet (600 mg total) by mouth every 6 (six) hours as needed for pain.   30 tablet   0     BP 117/49  Pulse 73  Temp 98.2 F (36.8 C) (Oral)  Resp 16  SpO2 100%  LMP 08/26/2012  Physical Exam  Nursing note and vitals reviewed. Constitutional: She is oriented to person, place, and time. Vital signs are normal. She appears well-developed and well-nourished. She is active and cooperative.  HENT:  Head: Normocephalic.  Mouth/Throat: Oropharynx is clear and moist. No oropharyngeal exudate.  Eyes: Conjunctivae normal are normal. Pupils are equal, round, and reactive to light. No scleral icterus.  Neck: Trachea normal and normal range of motion. Neck supple.  Cardiovascular: Normal rate, regular rhythm, normal heart sounds and intact distal pulses.   No murmur heard. Pulmonary/Chest: Effort normal and breath sounds normal.  Abdominal: Soft. Bowel sounds are normal. There is tenderness in the suprapubic area.  Genitourinary: Uterus normal. Pelvic exam was performed with patient supine. No labial fusion. There is no rash, tenderness, lesion or injury on the right labia. There is no rash, tenderness, lesion or injury on the left labia. Cervix exhibits no motion tenderness, no discharge and no friability. Right adnexum displays no mass, no tenderness and no fullness. Left adnexum displays no mass, no tenderness and no fullness. There is bleeding around the vagina. No erythema or tenderness around the vagina. No foreign body around the vagina. No  signs of injury around the vagina. No vaginal discharge found.  Lymphadenopathy:    She has no cervical adenopathy.       Right: No inguinal adenopathy present.       Left: No inguinal adenopathy present.  Neurological: She is alert and oriented to person, place, and time. No cranial nerve deficit or sensory deficit.  Skin: Skin is warm and dry. No rash noted.  Psychiatric: She has a normal mood and affect. Her speech is normal and behavior is  normal. Judgment and thought content normal. Cognition and memory are normal.    ED Course  Procedures (including critical care time)  Labs Reviewed  POCT URINALYSIS DIP (DEVICE) - Abnormal; Notable for the following:    Hgb urine dipstick LARGE (*)     All other components within normal limits  POCT I-STAT, CHEM 8 - Abnormal; Notable for the following:    Calcium, Ion 1.26 (*)     All other components within normal limits  POCT PREGNANCY, URINE  CERVICOVAGINAL ANCILLARY ONLY   No results found.   1. Abnormal vaginal bleeding       MDM  Await lab results, ibuprofen bid for 2 weeks, [facility] if symptoms are not improved.    09/02/12 Rx for BV sent to pharmacy, pt made aware.     Johnsie Kindred, NP 08/26/12 1834  Johnsie Kindred, NP 09/02/12 1111

## 2012-08-30 NOTE — ED Notes (Signed)
GC/Chlamydia neg., Affirm: Candida neg., Gardnerella pos., Trich neg.  Message to Lannie Fields NP that pt. has not been treated. Vassie Moselle 08/30/2012

## 2012-09-02 MED ORDER — METRONIDAZOLE 500 MG PO TABS: 500.0000 mg | ORAL_TABLET | Freq: Two times a day (BID) | ORAL | Status: DC

## 2012-09-02 NOTE — ED Provider Notes (Signed)
Medical screening examination/treatment/procedure(s) were performed by non-physician practitioner and as supervising physician I was immediately available for consultation/collaboration.  Leslee Home, M.D.   Reuben Likes, MD 09/02/12 [phone]

## 2012-09-25 NOTE — ED Notes (Signed)
1/19  Lannie Fields NP E-prescribed Flagyl and notified pt. Vassie Moselle 09/25/2012

## 2013-06-26 ENCOUNTER — Emergency Department (INDEPENDENT_AMBULATORY_CARE_PROVIDER_SITE_OTHER)
Admission: EM | Admit: 2013-06-26 | Discharge: 2013-06-26 | Disposition: A | Discharge status: Home / Self Care | Payer: Self-pay | Source: Home / Self Care | Attending: Emergency Medicine | Admitting: Emergency Medicine

## 2013-06-26 ENCOUNTER — Other Ambulatory Visit (HOSPITAL_COMMUNITY)
Admission: RE | Admit: 2013-06-26 | Discharge: 2013-06-26 | Disposition: A | Discharge status: Home / Self Care | Payer: Self-pay | Source: Ambulatory Visit | Attending: Emergency Medicine | Admitting: Emergency Medicine

## 2013-06-26 ENCOUNTER — Encounter (HOSPITAL_COMMUNITY): Payer: Self-pay | Admitting: Emergency Medicine

## 2013-06-26 DIAGNOSIS — Z113 Encounter for screening for infections with a predominantly sexual mode of transmission: Secondary | ICD-10-CM | POA: Insufficient documentation

## 2013-06-26 DIAGNOSIS — N760 Acute vaginitis: Secondary | ICD-10-CM | POA: Insufficient documentation

## 2013-06-26 DIAGNOSIS — N730 Acute parametritis and pelvic cellulitis: Secondary | ICD-10-CM

## 2013-06-26 LAB — POCT URINALYSIS DIP (DEVICE)
Glucose, UA: NEGATIVE mg/dL
Nitrite: NEGATIVE
Urobilinogen, UA: 0.2 mg/dL (ref 0.0–1.0)

## 2013-06-26 LAB — POCT PREGNANCY, URINE: Preg Test, Ur: NEGATIVE

## 2013-06-26 MED ORDER — AZITHROMYCIN 250 MG PO TABS
ORAL_TABLET | ORAL | Status: AC
  Filled 2013-06-26: qty 4

## 2013-06-26 MED ORDER — TRAMADOL HCL 50 MG PO TABS
100.0000 mg | ORAL_TABLET | Freq: Three times a day (TID) | ORAL | Status: DC | PRN
Start: 2013-06-26 — End: 2013-09-04

## 2013-06-26 MED ORDER — METRONIDAZOLE 500 MG PO TABS: 500.0000 mg | ORAL_TABLET | Freq: Three times a day (TID) | ORAL | Status: DC

## 2013-06-26 MED ORDER — CEFTRIAXONE SODIUM 250 MG IJ SOLR
250.0000 mg | Freq: Once | INTRAMUSCULAR | Status: AC
  Administered 2013-06-26: 250 mg via INTRAMUSCULAR

## 2013-06-26 MED ORDER — TRAMADOL HCL 50 MG PO TABS: 100.0000 mg | ORAL_TABLET | Freq: Three times a day (TID) | ORAL | Status: DC | PRN

## 2013-06-26 MED ORDER — AZITHROMYCIN 250 MG PO TABS
1000.0000 mg | ORAL_TABLET | Freq: Once | ORAL | Status: AC
  Administered 2013-06-26: 1000 mg via ORAL

## 2013-06-26 MED ORDER — CEFTRIAXONE SODIUM 250 MG IJ SOLR
INTRAMUSCULAR | Status: AC
  Filled 2013-06-26: qty 250

## 2013-06-26 MED ORDER — LIDOCAINE HCL (PF) 1 % IJ SOLN
INTRAMUSCULAR | Status: AC
  Filled 2013-06-26: qty 5

## 2013-06-26 NOTE — ED Notes (Signed)
Pt c/o lower abd pain onset 1.5 months Sxs also include constant bleeding; first couple of days she was going through 6 pads per day Lower abd pain is intermittent but increases when she walks or moves or applies pressure Reports her sxs started after sexual intercourse Denies: vag d/c, dysuria, f/v/n/d, back pain

## 2013-06-26 NOTE — ED Provider Notes (Signed)
Chief Complaint:   Chief Complaint  Patient presents with  . Abdominal Pain    History of Present Illness:   Meredith Jackson is a 27 year old female who has had a one-month history of bilateral pelvic pain. This feels like a pressure or sharp pain. Also for the past month she's had vaginal bleeding. It's been like a period with clots initially but now is more like spotting. She was soaking through 6 pads a day now is down to 3 pads. She denies any fever, chills, nausea, or vomiting. For the past week she's had a headache. She had unprotected intercourse before the symptoms began. She has an Implanad in place which expires in December of this year. She denies any vaginal discharge or itching. She's had no urinary symptoms.  Review of Systems:  Other than noted above, the patient denies any of the following symptoms: Systemic:  No fever, chills, sweats, or weight loss. GI:  No abdominal pain, nausea, anorexia, vomiting, diarrhea, constipation, melena or hematochezia. GU:  No dysuria, frequency, urgency, hematuria, vaginal discharge, itching, or abnormal vaginal bleeding. Skin:  No rash or itching.  PMFSH:  Past medical history, family history, social history, meds, and allergies were reviewed. She has a history of an abnormal Pap smear.  Physical Exam:   Vital signs:  BP 117/78  Pulse 64  Temp(Src) 98.3 F (36.8 C) (Oral)  Resp 18  SpO2 100%  LMP 06/26/2013 General:  Alert, oriented and in no distress. Lungs:  Breath sounds clear and equal bilaterally.  No wheezes, rales or rhonchi. Heart:  Regular rhythm.  No gallops or murmers. Abdomen:  Soft, flat and non-distended.  No organomegaly or mass.  No tenderness, guarding or rebound.  Bowel sounds normally active. Pelvic exam:  Normal external genitalia. There is a small amount of brown discharge in the vaginal vault. There was no bleeding. Cervix and uterus appears normal. There was no pain on cervical motion. She did have mild uterine  tenderness to palpation. Uterus was normal in size and shape. She has moderate bilateral adnexal tenderness to palpation, the right adnexa was full, and left adnexa was normal. There was no definite mass. DNA probe for gonorrhea, Chlamydia, Trichomonas, Gardnerella, Candida were obtained. Skin:  Clear, warm and dry.  Labs:   Results for orders placed during the hospital encounter of 06/26/13  POCT URINALYSIS DIP (DEVICE)      Result Value Range   Glucose, UA NEGATIVE  NEGATIVE mg/dL   Bilirubin Urine NEGATIVE  NEGATIVE   Ketones, ur NEGATIVE  NEGATIVE mg/dL   Specific Gravity, Urine 1.025  1.005 - 1.030   Hgb urine dipstick MODERATE (*) NEGATIVE   pH 7.0  5.0 - 8.0   Protein, ur NEGATIVE  NEGATIVE mg/dL   Urobilinogen, UA 0.2  0.0 - 1.0 mg/dL   Nitrite NEGATIVE  NEGATIVE   Leukocytes, UA NEGATIVE  NEGATIVE  POCT PREGNANCY, URINE      Result Value Range   Preg Test, Ur NEGATIVE  NEGATIVE     Course in Urgent Care Center:   She was given Rocephin 250 mg IM and azithromycin 1000 mg by mouth.  Assessment:  The encounter diagnosis was PID (acute pelvic inflammatory disease).  PID is probably the cause of her pain and bleeding, although other causes would be possible as well including fibroid tumors or ovarian cyst. Will need followup with gynecology.  Plan:   1.  Meds:  The following meds were prescribed:   Discharge Medication List as of  06/26/2013 11:45 AM    START taking these medications   Details  !! metroNIDAZOLE (FLAGYL) 500 MG tablet Take 1 tablet (500 mg total) by mouth 3 (three) times daily., Starting 06/26/2013, Until Discontinued, Normal    traMADol (ULTRAM) 50 MG tablet Take 2 tablets (100 mg total) by mouth every 8 (eight) hours as needed., Starting 06/26/2013, Until Discontinued, Normal     !! - Potential duplicate medications found. Please discuss with provider.      2.  Patient Education/Counseling:  The patient was given appropriate handouts, self care  instructions, and instructed in symptomatic relief.  Suggested rest and avoidance of intercourse till she's been checked out by gynecology.  3.  Follow up:  The patient was told to follow up if no better in 3 to 4 days, if becoming worse in any way, and given some red flag symptoms such as fever, worsening pain, or persistent vomiting which would prompt immediate return.  Follow up at [facility] next week.     Reuben Likes, MD 06/26/13 2236

## 2013-06-26 NOTE — ED Notes (Signed)
Call back number verified.  

## 2013-06-27 NOTE — ED Notes (Signed)
GC/Chlamydia neg.,  Affirm: Candida and Trich neg., Gardnerella pos.  Pt. adequately treated with Flagyl. Kordelia Severin M 06/27/2013  

## 2013-07-16 ENCOUNTER — Encounter (HOSPITAL_COMMUNITY): Payer: Self-pay | Admitting: Emergency Medicine

## 2013-07-16 ENCOUNTER — Emergency Department (HOSPITAL_COMMUNITY)
Admission: EM | Admit: 2013-07-16 | Discharge: 2013-07-16 | Disposition: A | Discharge status: Home / Self Care | Payer: Self-pay | Attending: Emergency Medicine | Admitting: Emergency Medicine

## 2013-07-16 DIAGNOSIS — Z23 Encounter for immunization: Secondary | ICD-10-CM | POA: Insufficient documentation

## 2013-07-16 DIAGNOSIS — IMO0002 Reserved for concepts with insufficient information to code with codable children: Secondary | ICD-10-CM | POA: Insufficient documentation

## 2013-07-16 DIAGNOSIS — W010XXA Fall on same level from slipping, tripping and stumbling without subsequent striking against object, initial encounter: Secondary | ICD-10-CM | POA: Insufficient documentation

## 2013-07-16 DIAGNOSIS — Y929 Unspecified place or not applicable: Secondary | ICD-10-CM | POA: Insufficient documentation

## 2013-07-16 DIAGNOSIS — Z79899 Other long term (current) drug therapy: Secondary | ICD-10-CM | POA: Insufficient documentation

## 2013-07-16 DIAGNOSIS — Y9302 Activity, running: Secondary | ICD-10-CM | POA: Insufficient documentation

## 2013-07-16 DIAGNOSIS — F17200 Nicotine dependence, unspecified, uncomplicated: Secondary | ICD-10-CM | POA: Insufficient documentation

## 2013-07-16 DIAGNOSIS — S81009A Unspecified open wound, unspecified knee, initial encounter: Secondary | ICD-10-CM | POA: Insufficient documentation

## 2013-07-16 MED ORDER — TETANUS-DIPHTH-ACELL PERTUSSIS 5-2.5-18.5 LF-MCG/0.5 IM SUSP
0.5000 mL | Freq: Once | INTRAMUSCULAR | Status: AC
  Administered 2013-07-16: 0.5 mL via INTRAMUSCULAR
  Filled 2013-07-16: qty 0.5

## 2013-07-16 MED ORDER — CLINDAMYCIN HCL 150 MG PO CAPS: 150.0000 mg | ORAL_CAPSULE | Freq: Four times a day (QID) | ORAL | Status: DC

## 2013-07-16 MED ORDER — HYDROCODONE-ACETAMINOPHEN 5-325 MG PO TABS: 2.0000 | ORAL_TABLET | ORAL | Status: DC | PRN

## 2013-07-16 MED ORDER — KETOROLAC TROMETHAMINE 60 MG/2ML IM SOLN
60.0000 mg | Freq: Once | INTRAMUSCULAR | Status: AC
  Administered 2013-07-16: 60 mg via INTRAMUSCULAR
  Filled 2013-07-16: qty 2

## 2013-07-16 NOTE — ED Notes (Addendum)
Per EMS, pt was walking home after work, fell down a small hill and has a laceration to her L lower leg below the knee, noted to be approximately 6cm in diameter and 2cm deep. Pt reports pain 8/10 at this time. Bleeding controlled at this time, bandage applied by EMS.

## 2013-07-16 NOTE — ED Notes (Signed)
Bed: ZO10 Expected date:  Expected time:  Means of arrival:  Comments: EMS 27yo F, left leg laceration

## 2013-07-16 NOTE — ED Provider Notes (Signed)
CSN: [id number]     Arrival date & time 07/16/13  2133 History   First MD Initiated Contact with Patient 07/16/13 2139     Chief Complaint  Patient presents with  . Extremity Laceration  . Leg Pain   (Consider location/radiation/quality/duration/timing/severity/associated sxs/prior Treatment) HPI Comments: Patient presents to the ER for evaluation of left knee area injury. Patient was running, tripped and fell, injuring the left lower leg just below the knee. Patient reports a large laceration. She reports that there is moderate to severe, constant pain that worsens with movement. She did not hit her head. No loss of consciousness. No neck or back pain. Also complains of a minor abrasion on the right hand.  Patient is a 27 y.o. female presenting with leg pain.  Leg Pain   History reviewed. No pertinent past medical history. Past Surgical History  Procedure Laterality Date  . Cesarean section      x2   History reviewed. No pertinent family history. History  Substance Use Topics  . Smoking status: Current Every Day Smoker -- 0.50 packs/day    Types: Cigarettes  . Smokeless tobacco: Not on file  . Alcohol Use: Yes     Comment: Occasional   OB History   Grav Para Term Preterm Abortions TAB SAB Ect Mult Living                 Review of Systems  Skin: Positive for wound.  Neurological: Negative for dizziness, syncope and headaches.    Allergies  Review of patient's allergies indicates no known allergies.  Home Medications   Current Outpatient Rx  Name  Route  Sig  Dispense  Refill  . etonogestrel (IMPLANON) 68 MG IMPL implant   Subcutaneous   Inject 1 each into the skin once.         Marland Kitchen ibuprofen (ADVIL,MOTRIN) 200 MG tablet   Oral   Take 600 mg by mouth every 6 (six) hours as needed (pain).         . metroNIDAZOLE (FLAGYL) 500 MG tablet   Oral   Take 1 tablet (500 mg total) by mouth 2 (two) times daily.   14 tablet   0   . metroNIDAZOLE (FLAGYL) 500 MG  tablet   Oral   Take 1 tablet (500 mg total) by mouth 3 (three) times daily.   30 tablet   0   . traMADol (ULTRAM) 50 MG tablet   Oral   Take 2 tablets (100 mg total) by mouth every 8 (eight) hours as needed.   30 tablet   0    BP 138/91  Pulse 75  Temp(Src) 97.8 F (36.6 C) (Oral)  Resp 12  SpO2 100%  LMP 06/26/2013 Physical Exam  Constitutional: She is oriented to person, place, and time. She appears well-developed and well-nourished. No distress.  HENT:  Head: Normocephalic and atraumatic.  Right Ear: Hearing normal.  Left Ear: Hearing normal.  Nose: Nose normal.  Mouth/Throat: Oropharynx is clear and moist and mucous membranes are normal.  Eyes: Conjunctivae and EOM are normal. Pupils are equal, round, and reactive to light.  Neck: Normal range of motion. Neck supple.  Cardiovascular: Regular rhythm, S1 normal and S2 normal.  Exam reveals no gallop and no friction rub.   No murmur heard. Pulmonary/Chest: Effort normal and breath sounds normal. No respiratory distress. She exhibits no tenderness.  Abdominal: Soft. Normal appearance and bowel sounds are normal. There is no hepatosplenomegaly. There is no tenderness. There is no  rebound, no guarding, no tenderness at McBurney's point and negative Murphy's sign. No hernia.  Musculoskeletal: Normal range of motion.       Left knee: She exhibits normal range of motion, no swelling, no effusion and no deformity.       Legs: Neurological: She is alert and oriented to person, place, and time. She has normal strength. No cranial nerve deficit or sensory deficit. Coordination normal. GCS eye subscore is 4. GCS verbal subscore is 5. GCS motor subscore is 6.  Skin: Skin is warm and dry. Abrasion and laceration noted. No rash noted. No cyanosis.     Psychiatric: She has a normal mood and affect. Her speech is normal and behavior is normal. Thought content normal.    ED Course  Procedures (including critical care time)  LACERATION  REPAIR Performed by: Gilda Crease. Authorized by: Gilda Crease Consent: Verbal consent obtained. Risks and benefits: risks, benefits and alternatives were discussed Consent given by: patient Patient identity confirmed: provided demographic data Prepped and Draped in normal sterile fashion Wound explored Laceration Location: left lower leg Laceration Length: 3cm No Foreign Bodies seen or palpated Anesthesia: local infiltration Local anesthetic: lidocaine 1% w epinephrine Anesthetic total: 5 ml Irrigation method: syringe Amount of cleaning: extensive - irrigated and then removal of small debris with forceps Skin closure: sutures Technique: 3 horizontal mattress sutures of 3-0 prolene with 2 simple interrupted sutures of 3-0 prolene spaced between mattress sutures. Small amount of sharp debridement necessary for damaged tissue.  Patient tolerance: Patient tolerated the procedure well with no immediate complications.  Labs Review Labs Reviewed - No data to display Imaging Review No results found.  EKG Interpretation   None       MDM  Diagnosis: Laceration  Patient presents with left leg laceration after a fall. Complex laceration with mild contamination and moderate tissue maceration/contusion. Will provide antibiotic coverage due to suspected residual contamination.    Gilda Crease, MD 07/16/13 2229

## 2013-07-27 ENCOUNTER — Emergency Department (INDEPENDENT_AMBULATORY_CARE_PROVIDER_SITE_OTHER)
Admission: EM | Admit: 2013-07-27 | Discharge: 2013-07-27 | Disposition: A | Discharge status: Home / Self Care | Payer: Self-pay | Source: Home / Self Care

## 2013-07-27 ENCOUNTER — Encounter (HOSPITAL_COMMUNITY): Payer: Self-pay | Admitting: Emergency Medicine

## 2013-07-27 DIAGNOSIS — Z4802 Encounter for removal of sutures: Secondary | ICD-10-CM

## 2013-07-27 NOTE — ED Provider Notes (Signed)
CSN: [id number]     Arrival date & time 07/27/13  1637 History   First MD Initiated Contact with Patient 07/27/13 1801     Chief Complaint  Patient presents with  . Suture / Staple Removal   (Consider location/radiation/quality/duration/timing/severity/associated sxs/prior Treatment) HPI Comments: For SR of repaired laceration of the L anterior shin from the ED  Patient is a 27 y.o. female presenting with suture removal.  Suture / Staple Removal    History reviewed. No pertinent past medical history. Past Surgical History  Procedure Laterality Date  . Cesarean section      x2   No family history on file. History  Substance Use Topics  . Smoking status: Current Every Day Smoker -- 0.50 packs/day    Types: Cigarettes  . Smokeless tobacco: Not on file  . Alcohol Use: Yes     Comment: Occasional   OB History   Grav Para Term Preterm Abortions TAB SAB Ect Mult Living                 Review of Systems  All other systems reviewed and are negative.    Allergies  Review of patient's allergies indicates no known allergies.  Home Medications   Current Outpatient Rx  Name  Route  Sig  Dispense  Refill  . clindamycin (CLEOCIN) 150 MG capsule   Oral   Take 1 capsule (150 mg total) by mouth every 6 (six) hours.   28 capsule   0   . etonogestrel (IMPLANON) 68 MG IMPL implant   Subcutaneous   Inject 1 each into the skin once.         Marland Kitchen HYDROcodone-acetaminophen (NORCO/VICODIN) 5-325 MG per tablet   Oral   Take 2 tablets by mouth every 4 (four) hours as needed.   10 tablet   0   . ibuprofen (ADVIL,MOTRIN) 200 MG tablet   Oral   Take 600 mg by mouth every 6 (six) hours as needed (pain).         . metroNIDAZOLE (FLAGYL) 500 MG tablet   Oral   Take 1 tablet (500 mg total) by mouth 2 (two) times daily.   14 tablet   0   . metroNIDAZOLE (FLAGYL) 500 MG tablet   Oral   Take 1 tablet (500 mg total) by mouth 3 (three) times daily.   30 tablet   0   . traMADol  (ULTRAM) 50 MG tablet   Oral   Take 2 tablets (100 mg total) by mouth every 8 (eight) hours as needed.   30 tablet   0    BP 108/72  Pulse 87  Temp(Src) 98.1 F (36.7 C) (Oral)  Resp 19  SpO2 99% Physical Exam  Constitutional: She is oriented to person, place, and time.  Neurological: She is alert and oriented to person, place, and time. She exhibits normal muscle tone.  Skin: Skin is warm and dry. No erythema.  Wound with thick external crust covering the sutures. No erythema. Beneath the crust wet tissue.   Psychiatric: She has a normal mood and affect.    ED Course  SUTURE REMOVAL Date/Time: 07/27/2013 7:20 PM Performed by: Phineas Real, Deonne Rooks Authorized by: Bradd Canary D Consent: Verbal consent obtained. Risks and benefits: risks, benefits and alternatives were discussed Consent given by: patient Patient understanding: patient states understanding of the procedure being performed Patient identity confirmed: verbally with patient Body area: lower extremity Location details: left lower leg Wound Appearance: moist Sutures Removed: 5 Post-removal: dressing  applied Patient tolerance: Patient tolerated the procedure well with no immediate complications.   (including critical care time) Labs Review Labs Reviewed - No data to display Imaging Review No results found.    MDM   1. Visit for suture removal     All sutures removed. Dressing applied. No ointments. Watch for infection, return for problems. This was a difficult and time consuming procedure due to the type of suture and the depth of the knots and overlying wet and hard soft tissue covering the sutures. Watch for infection Keep dry  Hayden Rasmussen, NP 07/27/13 1939  Hayden Rasmussen, NP 07/27/13 2005

## 2013-07-29 NOTE — ED Provider Notes (Signed)
Medical screening examination/treatment/procedure(s) were performed by resident physician or non-physician practitioner and as supervising physician I was immediately available for consultation/collaboration.   Blase Beckner DOUGLAS MD.   Roopa Graver D Ariya Bohannon, MD 07/29/13 1423 

## 2013-09-04 ENCOUNTER — Inpatient Hospital Stay (HOSPITAL_COMMUNITY)
Admission: AD | Admit: 2013-09-04 | Discharge: 2013-09-04 | Disposition: A | Discharge status: Home / Self Care | Payer: Self-pay | Source: Ambulatory Visit | Attending: Obstetrics and Gynecology | Admitting: Obstetrics and Gynecology

## 2013-09-04 ENCOUNTER — Encounter (HOSPITAL_COMMUNITY): Payer: Self-pay | Admitting: *Deleted

## 2013-09-04 DIAGNOSIS — N938 Other specified abnormal uterine and vaginal bleeding: Secondary | ICD-10-CM | POA: Insufficient documentation

## 2013-09-04 DIAGNOSIS — N949 Unspecified condition associated with female genital organs and menstrual cycle: Secondary | ICD-10-CM | POA: Insufficient documentation

## 2013-09-04 DIAGNOSIS — R109 Unspecified abdominal pain: Secondary | ICD-10-CM | POA: Insufficient documentation

## 2013-09-04 DIAGNOSIS — N760 Acute vaginitis: Secondary | ICD-10-CM | POA: Insufficient documentation

## 2013-09-04 DIAGNOSIS — F17200 Nicotine dependence, unspecified, uncomplicated: Secondary | ICD-10-CM | POA: Insufficient documentation

## 2013-09-04 DIAGNOSIS — A499 Bacterial infection, unspecified: Secondary | ICD-10-CM | POA: Insufficient documentation

## 2013-09-04 DIAGNOSIS — N926 Irregular menstruation, unspecified: Secondary | ICD-10-CM

## 2013-09-04 DIAGNOSIS — N939 Abnormal uterine and vaginal bleeding, unspecified: Secondary | ICD-10-CM

## 2013-09-04 DIAGNOSIS — B9689 Other specified bacterial agents as the cause of diseases classified elsewhere: Secondary | ICD-10-CM | POA: Insufficient documentation

## 2013-09-04 LAB — URINALYSIS, ROUTINE W REFLEX MICROSCOPIC
BILIRUBIN URINE: NEGATIVE
GLUCOSE, UA: NEGATIVE mg/dL
KETONES UR: NEGATIVE mg/dL
LEUKOCYTES UA: NEGATIVE
Nitrite: NEGATIVE
PH: 5.5 (ref 5.0–8.0)
Protein, ur: NEGATIVE mg/dL
Specific Gravity, Urine: >1.03 — ABNORMAL HIGH (ref 1.005–1.030)
Urobilinogen, UA: 0.2 mg/dL (ref 0.0–1.0)

## 2013-09-04 LAB — CBC
HEMATOCRIT: 38.5 % (ref 36.0–46.0)
Hemoglobin: 12.9 g/dL (ref 12.0–15.0)
MCH: 28.2 pg (ref 26.0–34.0)
MCHC: 33.5 g/dL (ref 30.0–36.0)
MCV: 84.2 fL (ref 78.0–100.0)
PLATELETS: 298 10*3/uL (ref 150–400)
RBC: 4.57 MIL/uL (ref 3.87–5.11)
RDW: 12.5 % (ref 11.5–15.5)
WBC: 6.3 10*3/uL (ref 4.0–10.5)

## 2013-09-04 LAB — URINE MICROSCOPIC-ADD ON

## 2013-09-04 LAB — WET PREP, GENITAL
TRICH WET PREP: NONE SEEN
Yeast Wet Prep HPF POC: NONE SEEN

## 2013-09-04 LAB — POCT PREGNANCY, URINE: PREG TEST UR: NEGATIVE

## 2013-09-04 MED ORDER — IBUPROFEN 600 MG PO TABS: 600.0000 mg | ORAL_TABLET | Freq: Four times a day (QID) | ORAL | Status: DC | PRN

## 2013-09-04 MED ORDER — METRONIDAZOLE 500 MG PO TABS: 500.0000 mg | ORAL_TABLET | Freq: Two times a day (BID) | ORAL | Status: DC

## 2013-09-04 NOTE — MAU Provider Note (Signed)
History     CSN: [id number]  Arrival date and time: 09/04/13 1101   First Provider Initiated Contact with Patient 09/04/13 1145      Chief Complaint  Patient presents with  . Vaginal Bleeding   HPI Ms. Meredith Jackson is a 28 y.o. G3P1200 who presents to MAU today with complaint of vaginal bleeding and lower abdominal pain. The patient states a history of irregular bleeding with the Nexplanon. She states that nexplanon expired on 07/29/13 and she has been having more irregular bleeding since then. She is also having associated suprapubic pain that she rates at 6/10 now. She has not taken anything for pain. She denies vaginal discharge or fever. She desires pregnancy and plans to get Nexplanon removed ASAP.   OB History   Grav Para Term Preterm Abortions TAB SAB Ect Mult Living   3 3 1 2             History reviewed. No pertinent past medical history.  Past Surgical History  Procedure Laterality Date  . Cesarean section      x2    Family History  Problem Relation Age of Onset  . Hyperthyroidism Mother     History  Substance Use Topics  . Smoking status: Current Every Day Smoker -- 0.50 packs/day    Types: Cigarettes  . Smokeless tobacco: Not on file  . Alcohol Use: Yes     Comment: Occasional    Allergies: No Known Allergies  Prescriptions prior to admission  Medication Sig Dispense Refill  . etonogestrel (IMPLANON) 68 MG IMPL implant Inject 1 each into the skin once.        Review of Systems  Constitutional: Negative for fever.  Gastrointestinal: Positive for abdominal pain.  Genitourinary:       Neg - vaginal discharge Neg - vaginal bleeding   Physical Exam   Blood pressure 118/88, pulse 86, temperature 98.3 F (36.8 C), resp. rate 20, height 5\' 3"  (1.6 m), weight 143 lb 6.4 oz (65.046 kg).  Physical Exam  Constitutional: She is oriented to person, place, and time. She appears well-developed and well-nourished. No distress.  HENT:  Head: Normocephalic  and atraumatic.  Cardiovascular: Normal rate.   Respiratory: Effort normal.  Genitourinary: Uterus is tender (mild). Uterus is not enlarged. Cervix exhibits no motion tenderness, no discharge and no friability. Right adnexum displays no mass and no tenderness. Left adnexum displays no mass and no tenderness. There is bleeding (scant blood noted in the vagina) around the vagina. No vaginal discharge found.  Neurological: She is alert and oriented to person, place, and time.  Skin: Skin is warm and dry. No erythema.  Psychiatric: She has a normal mood and affect.   Results for orders placed during the hospital encounter of 09/04/13 (from the past 24 hour(s))  URINALYSIS, ROUTINE W REFLEX MICROSCOPIC     Status: Abnormal   Collection Time    09/04/13 11:10 AM      Result Value Range   Color, Urine YELLOW  YELLOW   APPearance CLEAR  CLEAR   Specific Gravity, Urine >1.030 (*) 1.005 - 1.030   pH 5.5  5.0 - 8.0   Glucose, UA NEGATIVE  NEGATIVE mg/dL   Hgb urine dipstick LARGE (*) NEGATIVE   Bilirubin Urine NEGATIVE  NEGATIVE   Ketones, ur NEGATIVE  NEGATIVE mg/dL   Protein, ur NEGATIVE  NEGATIVE mg/dL   Urobilinogen, UA 0.2  0.0 - 1.0 mg/dL   Nitrite NEGATIVE  NEGATIVE   Leukocytes, UA  NEGATIVE  NEGATIVE  URINE MICROSCOPIC-ADD ON     Status: None   Collection Time    09/04/13 11:10 AM      Result Value Range   Squamous Epithelial / LPF RARE  RARE   WBC, UA 0-2  <3 WBC/hpf   RBC / HPF 3-6  <3 RBC/hpf   Bacteria, UA RARE  RARE   Urine-Other MUCOUS PRESENT    POCT PREGNANCY, URINE     Status: None   Collection Time    09/04/13 11:23 AM      Result Value Range   Preg Test, Ur NEGATIVE  NEGATIVE  WET PREP, GENITAL     Status: Abnormal   Collection Time    09/04/13 11:57 AM      Result Value Range   Yeast Wet Prep HPF POC NONE SEEN  NONE SEEN   Trich, Wet Prep NONE SEEN  NONE SEEN   Clue Cells Wet Prep HPF POC FEW (*) NONE SEEN   WBC, Wet Prep HPF POC FEW (*) NONE SEEN  CBC      Status: None   Collection Time    09/04/13 12:05 PM      Result Value Range   WBC 6.3  4.0 - 10.5 K/uL   RBC 4.57  3.87 - 5.11 MIL/uL   Hemoglobin 12.9  12.0 - 15.0 g/dL   HCT 38.5  36.0 - 46.0 %   MCV 84.2  78.0 - 100.0 fL   MCH 28.2  26.0 - 34.0 pg   MCHC 33.5  30.0 - 36.0 g/dL   RDW 12.5  11.5 - 15.5 %   Platelets 298  150 - 400 K/uL    MAU Course  Procedures None  MDM UPT - negative UA, wet prep, GC/chlamydia and CBC today  Assessment and Plan  A: Irregular bleeding with Nexplanon Bacterial vaginosis  P: Discharge home Rx for Flagyl and ibuprofen sent to patient's pharmacy Patient advised to follow-up with [facility] for removal of Nexplanon Patient may return to MAU as needed or if her condition were to change or worsen  Farris Has, PA-C  09/04/2013, 12:39 PM

## 2013-09-04 NOTE — Progress Notes (Signed)
Almyra Free Either PA in to see pt and discuss test results and d/c plan. Written and verbal d/c instructions given and understanding voiced.

## 2013-09-04 NOTE — Discharge Instructions (Signed)

## 2013-09-04 NOTE — MAU Note (Signed)
Implanon expired Dec 15th 2014 but still in arm. Ever since then I have been having irreg bleeding. Bleed one day and not the next. Having bad cramping. I normally have irreg periods anyway

## 2013-09-04 NOTE — MAU Provider Note (Signed)
Attestation of Attending Supervision of Advanced Practitioner (CNM/NP): Evaluation and management procedures were performed by the Advanced Practitioner under my supervision and collaboration.  I have reviewed the Advanced Practitioner's note and chart, and I agree with the management and plan.  Josue Kass 09/04/2013 1:10 PM

## 2013-09-05 LAB — GC/CHLAMYDIA PROBE AMP
CT Probe RNA: NEGATIVE
GC Probe RNA: NEGATIVE

## 2014-03-07 ENCOUNTER — Encounter (HOSPITAL_COMMUNITY): Payer: Self-pay | Admitting: Emergency Medicine

## 2014-03-07 ENCOUNTER — Emergency Department (HOSPITAL_COMMUNITY)
Admission: EM | Admit: 2014-03-07 | Discharge: 2014-03-07 | Disposition: A | Discharge status: Home / Self Care | Payer: Self-pay | Attending: Emergency Medicine | Admitting: Emergency Medicine

## 2014-03-07 DIAGNOSIS — W57XXXA Bitten or stung by nonvenomous insect and other nonvenomous arthropods, initial encounter: Secondary | ICD-10-CM | POA: Insufficient documentation

## 2014-03-07 DIAGNOSIS — Z792 Long term (current) use of antibiotics: Secondary | ICD-10-CM | POA: Insufficient documentation

## 2014-03-07 DIAGNOSIS — Y9289 Other specified places as the place of occurrence of the external cause: Secondary | ICD-10-CM | POA: Insufficient documentation

## 2014-03-07 DIAGNOSIS — S40269A Insect bite (nonvenomous) of unspecified shoulder, initial encounter: Secondary | ICD-10-CM | POA: Insufficient documentation

## 2014-03-07 DIAGNOSIS — S90569A Insect bite (nonvenomous), unspecified ankle, initial encounter: Secondary | ICD-10-CM | POA: Insufficient documentation

## 2014-03-07 DIAGNOSIS — F17200 Nicotine dependence, unspecified, uncomplicated: Secondary | ICD-10-CM | POA: Insufficient documentation

## 2014-03-07 DIAGNOSIS — Y9389 Activity, other specified: Secondary | ICD-10-CM | POA: Insufficient documentation

## 2014-03-07 DIAGNOSIS — R21 Rash and other nonspecific skin eruption: Secondary | ICD-10-CM | POA: Insufficient documentation

## 2014-03-07 NOTE — ED Notes (Signed)
Pt states she has a rash similar to her daughters that she has had for 4 weeks.

## 2014-03-07 NOTE — ED Provider Notes (Signed)
CSN: [id number]     Arrival date & time 03/07/14  2022 History   First MD Initiated Contact with Patient 03/07/14 2104     Chief Complaint  Patient presents with  . Rash     (Consider location/radiation/quality/duration/timing/severity/associated sxs/prior Treatment) Patient is a 28 y.o. female presenting with rash. The history is provided by the patient.  Rash Location:  Leg and shoulder/arm Shoulder/arm rash location:  R upper arm Leg rash location:  L upper leg Quality: dryness and itchiness   Severity:  Mild Duration:  4 weeks (off and on) Relieved by:  Nothing Worsened by:  Heat Ineffective treatments:  Anti-itch cream  Meredith Jackson is a 28 y.o. female with a rash that started a few weeks ago. She thinks the areas are bug bites. They go away and then come back. Her daughter had the same rash and went to their PCP and was treated with antibiotics because she had scratched the areas and gotten them infected. The patient has one area on the back of her left leg and one on the right upper arm. She complains of itching. She denies any other problems tonight.    History reviewed. No pertinent past medical history. Past Surgical History  Procedure Laterality Date  . Cesarean section      x2   Family History  Problem Relation Age of Onset  . Hyperthyroidism Mother    History  Substance Use Topics  . Smoking status: Current Every Day Smoker -- 0.50 packs/day    Types: Cigarettes  . Smokeless tobacco: Not on file  . Alcohol Use: Yes     Comment: Occasional   OB History   Grav Para Term Preterm Abortions TAB SAB Ect Mult Living   3 3 1 2            Review of Systems  Skin: Positive for rash.  All other systems negative.     Allergies  Review of patient's allergies indicates no known allergies.  Home Medications   Prior to Admission medications   Medication Sig Start Date End Date Taking? Authorizing Provider  etonogestrel (IMPLANON) 68 MG IMPL implant Inject 1  each into the skin once.    Historical Provider, MD  ibuprofen (ADVIL,MOTRIN) 600 MG tablet Take 1 tablet (600 mg total) by mouth every 6 (six) hours as needed. 09/04/13   Farris Has, PA-C  metroNIDAZOLE (FLAGYL) 500 MG tablet Take 1 tablet (500 mg total) by mouth 2 (two) times daily. 09/04/13   Farris Has, PA-C   BP 107/75  Pulse 88  Temp(Src) 98.8 F (37.1 C) (Oral)  Resp 20  Wt 148 lb (67.132 kg)  SpO2 100% Physical Exam  Nursing note and vitals reviewed. Constitutional: She is oriented to person, place, and time. She appears well-developed and well-nourished. No distress.  HENT:  Head: Normocephalic.  Eyes: Conjunctivae and EOM are normal.  Neck: Neck supple.  Cardiovascular: Normal rate.   Pulmonary/Chest: Effort normal. No respiratory distress. She has no wheezes.  Musculoskeletal: Normal range of motion.  See skin exam  Neurological: She is alert and oriented to person, place, and time. No cranial nerve deficit.  Skin: Skin is warm and dry. Rash noted.  Red raised papular areas noted to the right upper arm and the posterior aspect of the left thigh.   Psychiatric: She has a normal mood and affect. Her behavior is normal.    ED Course  Procedures  MDM  28 y.o. female with what appears as insect  bites to the upper right arm and left thigh. She will take benadryl for itching and use antibiotic ointment on the area. She will follow up with her PCP or return here as needed for problems. Stable for discharge without fever or signs of infection at this time.      [place], [place] 03/08/14 [phone]

## 2014-03-10 NOTE — ED Provider Notes (Signed)
Medical screening examination/treatment/procedure(s) were performed by non-physician practitioner and as supervising physician I was immediately available for consultation/collaboration.   EKG Interpretation None       Threasa Beards, MD 03/10/14 [phone]

## 2014-05-25 ENCOUNTER — Emergency Department (HOSPITAL_COMMUNITY)
Admission: EM | Admit: 2014-05-25 | Discharge: 2014-05-25 | Disposition: A | Discharge status: Home / Self Care | Payer: Self-pay | Attending: Emergency Medicine | Admitting: Emergency Medicine

## 2014-05-25 ENCOUNTER — Encounter (HOSPITAL_COMMUNITY): Payer: Self-pay | Admitting: Emergency Medicine

## 2014-05-25 DIAGNOSIS — R21 Rash and other nonspecific skin eruption: Secondary | ICD-10-CM | POA: Insufficient documentation

## 2014-05-25 DIAGNOSIS — Z720 Tobacco use: Secondary | ICD-10-CM | POA: Insufficient documentation

## 2014-05-25 LAB — RPR

## 2014-05-25 LAB — HIV ANTIBODY (ROUTINE TESTING W REFLEX): HIV: NONREACTIVE

## 2014-05-25 MED ORDER — HYDROXYZINE HCL 25 MG PO TABS: 25.0000 mg | ORAL_TABLET | Freq: Four times a day (QID) | ORAL | Status: DC

## 2014-05-25 MED ORDER — HYDROXYZINE HCL 10 MG PO TABS
10.0000 mg | ORAL_TABLET | Freq: Once | ORAL | Status: AC
  Administered 2014-05-25: 10 mg via ORAL
  Filled 2014-05-25: qty 1

## 2014-05-25 MED ORDER — HYDROCORTISONE 2.5 % EX LOTN: TOPICAL_LOTION | Freq: Two times a day (BID) | CUTANEOUS | Status: DC

## 2014-05-25 MED ORDER — DOXYCYCLINE HYCLATE 100 MG PO CAPS: 100.0000 mg | ORAL_CAPSULE | Freq: Two times a day (BID) | ORAL | Status: DC

## 2014-05-25 NOTE — ED Notes (Signed)
Declined W/C at D/C and was escorted to lobby by RN. 

## 2014-05-25 NOTE — ED Provider Notes (Signed)
Medical screening examination/treatment/procedure(s) were performed by non-physician practitioner and as supervising physician I was immediately available for consultation/collaboration.   EKG Interpretation None        Ezequiel Essex, MD 05/25/14 1758

## 2014-05-25 NOTE — ED Notes (Signed)
Pt reports possible bit by spider 3 days ago. Pt reports blisters and rash all over her body x 3 days.

## 2014-05-25 NOTE — Discharge Instructions (Signed)

## 2014-05-25 NOTE — ED Provider Notes (Signed)
CSN: [id number]     Arrival date & time 05/25/14  1222 History  This chart was scribed for non-physician practitioner Delos Haring, PA-C working with Ezequiel Essex, MD by Lora Havens, ED Scribe. This patient was seen in TR10C/TR10C and the patient's care was started at 12:53 PM.  Chief Complaint  Patient presents with  . Insect Bite  . Rash   Patient is a 28 y.o. female presenting with rash. The history is provided by the patient. No language interpreter was used.  Rash Associated symptoms: no diarrhea, no fever, no nausea and not vomiting    HPI Comments: Meredith Jackson is a 28 y.o. female who presents to the Emergency Department complaining of a rash and bumps all over her body that first began one month ago. She has new bumps and rashes which began 3 days ago. She believes she may have been bitten by a spider which occurred 3 days ago. She notes she has some drainage on her bumps along with an itching and burning sensation. Her daughter does not have any similar rashes but does have similar bump on her back . Pt has used a cortisone cream for her rash and blisters and that gave her some mild relief. Pt denies nausea, vomiting, diarrhea, dysuria, vaginal discharge or odor. She has a history of asthma and had chickenpox as a kid. Pt has no known STDs, and is not on [facility] but does have unprotected sex with her boyfriend. Pt has new tattoos that she got a month ago. Pt works at a call center.   History reviewed. No pertinent past medical history. Past Surgical History  Procedure Laterality Date  . Cesarean section      x2   Family History  Problem Relation Age of Onset  . Hyperthyroidism Mother    History  Substance Use Topics  . Smoking status: Current Every Day Smoker -- 0.50 packs/day    Types: Cigarettes  . Smokeless tobacco: Not on file  . Alcohol Use: Yes     Comment: Occasional   OB History   Grav Para Term Preterm Abortions TAB SAB Ect Mult Living   3 3 1 2             Review of Systems  Constitutional: Negative for fever.  Gastrointestinal: Negative for nausea, vomiting and diarrhea.  Genitourinary: Negative for dysuria and vaginal discharge.  Skin: Positive for rash.  All other systems reviewed and are negative.     Allergies  Review of patient's allergies indicates no known allergies.  Home Medications   Prior to Admission medications   Medication Sig Start Date End Date Taking? Authorizing Provider  etonogestrel (IMPLANON) 68 MG IMPL implant Inject 1 each into the skin once.    Historical Provider, MD  ibuprofen (ADVIL,MOTRIN) 600 MG tablet Take 1 tablet (600 mg total) by mouth every 6 (six) hours as needed. 09/04/13   Luvenia Redden, PA-C  metroNIDAZOLE (FLAGYL) 500 MG tablet Take 1 tablet (500 mg total) by mouth 2 (two) times daily. 09/04/13   Luvenia Redden, PA-C   BP 120/72  Pulse 86  Temp(Src) 98.8 F (37.1 C) (Oral)  Resp 16  Ht 5\' 3"  (1.6 m)  Wt 145 lb (65.772 kg)  BMI 25.69 kg/m2  SpO2 97% Physical Exam  Nursing note and vitals reviewed. Constitutional: She is oriented to person, place, and time. She appears well-developed and well-nourished. No distress.  HENT:  Head: Normocephalic and atraumatic.  Eyes: EOM are normal.  Neck: Normal  range of motion.  Cardiovascular: Normal rate.   Pulmonary/Chest: Effort normal.  Neurological: She is alert and oriented to person, place, and time.  Skin: Skin is warm and dry. Rash noted. No purpura noted. Rash is not vesicular and not urticarial.  Diffuse plaques measuring from 1-3 inches in diameter.   Most localized to buttocks, upper back, and distal arms. Pt scratching body during exam uncontrollably    Psychiatric: She has a normal mood and affect. Her behavior is normal.    ED Course  Procedures  DIAGNOSTIC STUDIES: Oxygen Saturation is 97% on room air, adequate by my interpretation.    COORDINATION OF CARE: 1:12 PM- Will draw blood for lab tests- due to tattoos and  unprotected sex will send out RPR and HIV titers due to chronic rash of unknown etiology. Give pt abx, topical steroids and medication to stop the itching along with a cream. Advised not to use medication on face or genitals. Will give a dose of atarax and prescription for the same. Pt agreed to treatment plan.  Pt otherwise healthy and does not have systemic symptoms. Denies recent unexpected weight gain or weight loss. Denies fevers, headaches, vomiting, diarrhea or fatigue. Denies drug use.   Labs Review Labs Reviewed  HIV ANTIBODY (ROUTINE TESTING)  RPR    Imaging Review No results found.   EKG Interpretation None      MDM   Final diagnoses:  Rash   28 y.o.Meredith Jackson's evaluation in the Emergency Department is complete. It has been determined that no acute conditions requiring further emergency intervention are present at this time. The patient/guardian have been advised of the diagnosis and plan. We have discussed signs and symptoms that warrant return to the ED, such as changes or worsening in symptoms.  Vital signs are stable at discharge. Filed Vitals:   05/25/14 1231  BP: 120/72  Pulse: 86  Temp: 98.8 F (37.1 C)  Resp: 16    Patient/guardian has voiced understanding and agreed to follow-up with the PCP or specialist.  I personally performed the services described in this documentation, which was scribed in my presence. The recorded information has been reviewed and is accurate.     Linus Mako, PA-C 05/25/14 1400

## 2014-06-16 ENCOUNTER — Encounter (HOSPITAL_COMMUNITY): Payer: Self-pay | Admitting: Emergency Medicine

## 2014-06-20 ENCOUNTER — Encounter (HOSPITAL_COMMUNITY): Payer: Self-pay | Admitting: *Deleted

## 2014-06-20 ENCOUNTER — Emergency Department (HOSPITAL_COMMUNITY)
Admission: EM | Admit: 2014-06-20 | Discharge: 2014-06-20 | Discharge status: Against Medical Advice | Payer: Self-pay | Attending: Emergency Medicine | Admitting: Emergency Medicine

## 2014-06-20 ENCOUNTER — Emergency Department (HOSPITAL_COMMUNITY)
Admission: EM | Admit: 2014-06-20 | Discharge: 2014-06-21 | Disposition: A | Discharge status: Home / Self Care | Payer: No Typology Code available for payment source | Attending: Emergency Medicine | Admitting: Emergency Medicine

## 2014-06-20 ENCOUNTER — Encounter (HOSPITAL_COMMUNITY): Payer: Self-pay

## 2014-06-20 DIAGNOSIS — Z720 Tobacco use: Secondary | ICD-10-CM | POA: Insufficient documentation

## 2014-06-20 DIAGNOSIS — Y9389 Activity, other specified: Secondary | ICD-10-CM | POA: Insufficient documentation

## 2014-06-20 DIAGNOSIS — T07 Unspecified multiple injuries: Secondary | ICD-10-CM | POA: Insufficient documentation

## 2014-06-20 DIAGNOSIS — Z792 Long term (current) use of antibiotics: Secondary | ICD-10-CM | POA: Insufficient documentation

## 2014-06-20 DIAGNOSIS — S0993XA Unspecified injury of face, initial encounter: Secondary | ICD-10-CM | POA: Diagnosis not present

## 2014-06-20 DIAGNOSIS — S4991XA Unspecified injury of right shoulder and upper arm, initial encounter: Secondary | ICD-10-CM | POA: Diagnosis not present

## 2014-06-20 DIAGNOSIS — Y92410 Unspecified street and highway as the place of occurrence of the external cause: Secondary | ICD-10-CM | POA: Insufficient documentation

## 2014-06-20 DIAGNOSIS — S4992XA Unspecified injury of left shoulder and upper arm, initial encounter: Secondary | ICD-10-CM | POA: Diagnosis not present

## 2014-06-20 DIAGNOSIS — Z79899 Other long term (current) drug therapy: Secondary | ICD-10-CM | POA: Diagnosis not present
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
## 2014-06-20 NOTE — ED Notes (Signed)
Pt stating that she needs to leave due to child being sick.  Informed pt that upon return her registration would have to be restarted. Pt verbalized understanding.  Apologized for having to leave.
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
## 2014-06-21 MED ORDER — METHOCARBAMOL 750 MG PO TABS: 750.0000 mg | ORAL_TABLET | Freq: Three times a day (TID) | ORAL | Status: DC | PRN

## 2014-06-21 MED ORDER — IBUPROFEN 400 MG PO TABS
600.0000 mg | ORAL_TABLET | Freq: Once | ORAL | Status: AC
  Administered 2014-06-21: 05:00:00 600 mg via ORAL
  Filled 2014-06-21 (×2): qty 1

## 2014-06-21 MED ORDER — NAPROXEN 500 MG PO TABS: 500.0000 mg | ORAL_TABLET | Freq: Two times a day (BID) | ORAL | Status: DC

## 2014-06-21 MED ORDER — HYDROCODONE-ACETAMINOPHEN 5-325 MG PO TABS: 1.0000 | ORAL_TABLET | ORAL | Status: DC | PRN

## 2014-06-21 MED ORDER — HYDROCODONE-ACETAMINOPHEN 5-325 MG PO TABS: 2.0000 | ORAL_TABLET | Freq: Once | ORAL | Status: DC
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
## 2014-06-21 NOTE — Discharge Instructions (Signed)
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
## 2014-06-21 NOTE — ED Notes (Signed)
Pt verbalizes understanding of d/c instructions and directions for follow-up care. Pt verbalizes no additional questions or concerns.

## 2014-10-30 ENCOUNTER — Encounter (HOSPITAL_COMMUNITY): Payer: Self-pay | Admitting: *Deleted

## 2014-10-30 ENCOUNTER — Emergency Department (INDEPENDENT_AMBULATORY_CARE_PROVIDER_SITE_OTHER)
Admission: EM | Admit: 2014-10-30 | Discharge: 2014-10-30 | Disposition: A | Discharge status: Home / Self Care | Payer: BLUE CROSS/BLUE SHIELD | Source: Home / Self Care | Attending: Family Medicine | Admitting: Family Medicine

## 2014-10-30 DIAGNOSIS — H00016 Hordeolum externum left eye, unspecified eyelid: Secondary | ICD-10-CM

## 2014-10-30 DIAGNOSIS — H109 Unspecified conjunctivitis: Secondary | ICD-10-CM

## 2014-10-30 MED ORDER — TOBRAMYCIN 0.3 % OP SOLN: 2.0000 [drp] | OPHTHALMIC | Status: DC

## 2014-10-30 NOTE — ED Provider Notes (Signed)
CSN: [id number]     Arrival date & time 10/30/14  1618 History   First MD Initiated Contact with Patient 10/30/14 1635     Chief Complaint  Patient presents with  . Eye Problem   (Consider location/radiation/quality/duration/timing/severity/associated sxs/prior Treatment) HPI Comments: Woke this morning with redness of left conjunctiva and redness, swelling and tenderness of left upper eyelid. No changes in vision. Does not wear contact lenses. No eye pain. Reports herself to be otherwise healthy.    Patient is a 29 y.o. female presenting with eye problem. The history is provided by the patient.  Eye Problem Location:  L eye   History reviewed. No pertinent past medical history. Past Surgical History  Procedure Laterality Date  . Cesarean section      x2   Family History  Problem Relation Age of Onset  . Hyperthyroidism Mother    History  Substance Use Topics  . Smoking status: Current Every Day Smoker -- 0.50 packs/day    Types: Cigarettes  . Smokeless tobacco: Not on file  . Alcohol Use: Yes     Comment: Occasional   OB History    Gravida Para Term Preterm AB TAB SAB Ectopic Multiple Living   3 3 1 2            Review of Systems  All other systems reviewed and are negative.   Allergies  Review of patient's allergies indicates no known allergies.  Home Medications   Prior to Admission medications   Medication Sig Start Date End Date Taking? Authorizing Provider  doxycycline (VIBRAMYCIN) 100 MG capsule Take 1 capsule (100 mg total) by mouth 2 (two) times daily. 05/25/14   Tiffany Carlota Raspberry, PA-C  etonogestrel (IMPLANON) 68 MG IMPL implant Inject 1 each into the skin once.    Historical Provider, MD  HYDROcodone-acetaminophen (NORCO/VICODIN) 5-325 MG per tablet Take 1-2 tablets by mouth every 4 (four) hours as needed. 06/21/14   Linton Flemings, MD  hydrocortisone 2.5 % lotion Apply topically 2 (two) times daily. 05/25/14   Tiffany Carlota Raspberry, PA-C  hydrOXYzine (ATARAX/VISTARIL)  25 MG tablet Take 1 tablet (25 mg total) by mouth every 6 (six) hours. 05/25/14   Tiffany Carlota Raspberry, PA-C  ibuprofen (ADVIL,MOTRIN) 600 MG tablet Take 1 tablet (600 mg total) by mouth every 6 (six) hours as needed. 09/04/13   Luvenia Redden, PA-C  methocarbamol (ROBAXIN-750) 750 MG tablet Take 1 tablet (750 mg total) by mouth every 8 (eight) hours as needed for muscle spasms. 06/21/14   Linton Flemings, MD  metroNIDAZOLE (FLAGYL) 500 MG tablet Take 1 tablet (500 mg total) by mouth 2 (two) times daily. 09/04/13   Luvenia Redden, PA-C  naproxen (NAPROSYN) 500 MG tablet Take 1 tablet (500 mg total) by mouth 2 (two) times daily. 06/21/14   Linton Flemings, MD  tobramycin (TOBREX) 0.3 % ophthalmic solution Place 2 drops into the left eye every 4 (four) hours. X 5 days 10/30/14   Annett Gula H Starlet Gallentine, PA   BP 132/77 mmHg  Pulse 88  Temp(Src) 98.7 F (37.1 C) (Oral)  Resp 16  SpO2 100%  LMP 10/27/2014 Physical Exam  Constitutional: She is oriented to person, place, and time. She appears well-developed and well-nourished. No distress.  HENT:  Head: Normocephalic and atraumatic.  Right Ear: Hearing and external ear normal.  Left Ear: Hearing and external ear normal.  Nose: Nose normal.  Eyes: EOM are normal. Pupils are equal, round, and reactive to light. Lids are everted and swept, no foreign  bodies found. Left eye exhibits hordeolum. Left eye exhibits no chemosis, no discharge and no exudate. No foreign body present in the left eye. Left conjunctiva is injected. Left conjunctiva has no hemorrhage.    Cardiovascular: Normal rate.   Pulmonary/Chest: Effort normal.  Musculoskeletal: Normal range of motion.  Neurological: She is alert and oriented to person, place, and time.  Skin: Skin is warm and dry.  Psychiatric: She has a normal mood and affect. Her behavior is normal.  Nursing note and vitals reviewed.   ED Course  Procedures (including critical care time) Labs Review Labs Reviewed - No data to  display  Imaging Review No results found.   MDM   1. Conjunctivitis of left eye   2. Hordeolum, left   Warm compresses and Tobrex as prescribed with follow up if no improvement over the next 2-3 days.     Lutricia Feil, [place] 10/30/14 [phone]

## 2014-10-30 NOTE — Discharge Instructions (Signed)
Bacterial Conjunctivitis °Bacterial conjunctivitis, commonly called pink eye, is an inflammation of the clear membrane that covers the white part of the eye (conjunctiva). The inflammation can also happen on the underside of the eyelids. The blood vessels in the conjunctiva become inflamed, causing the eye to become red or pink. Bacterial conjunctivitis may spread easily from one eye to another and from person to person (contagious).  °CAUSES  °Bacterial conjunctivitis is caused by bacteria. The bacteria may come from your own skin, your upper respiratory tract, or from someone else with bacterial conjunctivitis. °SYMPTOMS  °The normally white color of the eye or the underside of the eyelid is usually pink or red. The pink eye is usually associated with irritation, tearing, and some sensitivity to light. Bacterial conjunctivitis is often associated with a thick, yellowish discharge from the eye. The discharge may turn into a crust on the eyelids overnight, which causes your eyelids to stick together. If a discharge is present, there may also be some blurred vision in the affected eye. °DIAGNOSIS  °Bacterial conjunctivitis is diagnosed by your caregiver through an eye exam and the symptoms that you report. Your caregiver looks for changes in the surface tissues of your eyes, which may point to the specific type of conjunctivitis. A sample of any discharge may be collected on a cotton-tip swab if you have a severe case of conjunctivitis, if your cornea is affected, or if you keep getting repeat infections that do not respond to treatment. The sample will be sent to a lab to see if the inflammation is caused by a bacterial infection and to see if the infection will respond to antibiotic medicines. °TREATMENT  °· Bacterial conjunctivitis is treated with antibiotics. Antibiotic eyedrops are most often used. However, antibiotic ointments are also available. Antibiotics pills are sometimes used. Artificial tears or eye  washes may ease discomfort. °HOME CARE INSTRUCTIONS  °· To ease discomfort, apply a cool, clean washcloth to your eye for 10-20 minutes, 3-4 times a day. °· Gently wipe away any drainage from your eye with a warm, wet washcloth or a cotton ball. °· Wash your hands often with soap and water. Use paper towels to dry your hands. °· Do not share towels or washcloths. This may spread the infection. °· Change or wash your pillowcase every day. °· You should not use eye makeup until the infection is gone. °· Do not operate machinery or drive if your vision is blurred. °· Stop using contact lenses. Ask your caregiver how to sterilize or replace your contacts before using them again. This depends on the type of contact lenses that you use. °· When applying medicine to the infected eye, do not touch the edge of your eyelid with the eyedrop bottle or ointment tube. °SEEK IMMEDIATE MEDICAL CARE IF:  °· Your infection has not improved within 3 days after beginning treatment. °· You had yellow discharge from your eye and it returns. °· You have increased eye pain. °· Your eye redness is spreading. °· Your vision becomes blurred. °· You have a fever or persistent symptoms for more than 2-3 days. °· You have a fever and your symptoms suddenly get worse. °· You have facial pain, redness, or swelling. °MAKE SURE YOU:  °· Understand these instructions. °· Will watch your condition. °· Will get help right away if you are not doing well or get worse. °Document Released: 08/01/2005 Document Revised: 12/16/2013 Document Reviewed: 01/02/2012 °[employer]® Patient Information ©2015 [employer]. This information is not intended to   replace advice given to you by your health care provider. Make sure you discuss any questions you have with your health care provider.  Sty A sty (hordeolum) is an infection of a gland in the eyelid located at the base of the eyelash. A sty may develop a white or yellow head of pus. It can be puffy (swollen).  Usually, the sty will burst and pus will come out on its own. They do not leave lumps in the eyelid once they drain. A sty is often confused with another form of cyst of the eyelid called a chalazion. Chalazions occur within the eyelid and not on the edge where the bases of the eyelashes are. They often are red, sore and then form firm lumps in the eyelid. CAUSES   Germs (bacteria).  Lasting (chronic) eyelid inflammation. SYMPTOMS   Tenderness, redness and swelling along the edge of the eyelid at the base of the eyelashes.  Sometimes, there is a white or yellow head of pus. It may or may not drain. DIAGNOSIS  An ophthalmologist will be able to distinguish between a sty and a chalazion and treat the condition appropriately.  TREATMENT   Styes are typically treated with warm packs (compresses) until drainage occurs.  In rare cases, medicines that kill germs (antibiotics) may be prescribed. These antibiotics may be in the form of drops, cream or pills.  If a hard lump has formed, it is generally necessary to do a small incision and remove the hardened contents of the cyst in a minor surgical procedure done in the office.  In suspicious cases, your caregiver may send the contents of the cyst to the lab to be certain that it is not a rare, but dangerous form of cancer of the glands of the eyelid. HOME CARE INSTRUCTIONS   Wash your hands often and dry them with a clean towel. Avoid touching your eyelid. This may spread the infection to other parts of the eye.  Apply heat to your eyelid for 10 to 20 minutes, several times a day, to ease pain and help to heal it faster.  Do not squeeze the sty. Allow it to drain on its own. Wash your eyelid carefully 3 to 4 times per day to remove any pus. SEEK IMMEDIATE MEDICAL CARE IF:   Your eye becomes painful or puffy (swollen).  Your vision changes.  Your sty does not drain by itself within 3 days.  Your sty comes back within a short period of  time, even with treatment.  You have redness (inflammation) around the eye.  You have a fever. Document Released: 05/11/2005 Document Revised: 10/24/2011 Document Reviewed: 11/15/2013 [facility] Patient Information 2015 [place], [place]. This information is not intended to replace advice given to you by your health care provider. Make sure you discuss any questions you have with your health care provider.

## 2014-10-30 NOTE — ED Notes (Signed)
Pt        Reports         Symptoms   Of         nsal  Congestion  And  Uri  Symptoms    -  Noticed        Redness  And  Watering  Of  Her  l  Eye

## 2016-12-31 ENCOUNTER — Encounter (HOSPITAL_COMMUNITY): Payer: Self-pay | Admitting: Emergency Medicine

## 2016-12-31 ENCOUNTER — Ambulatory Visit (HOSPITAL_COMMUNITY)
Admission: EM | Admit: 2016-12-31 | Discharge: 2016-12-31 | Disposition: A | Discharge status: Home / Self Care | Payer: Medicaid Other | Attending: Internal Medicine | Admitting: Internal Medicine

## 2016-12-31 DIAGNOSIS — Z202 Contact with and (suspected) exposure to infections with a predominantly sexual mode of transmission: Secondary | ICD-10-CM | POA: Insufficient documentation

## 2016-12-31 DIAGNOSIS — Z113 Encounter for screening for infections with a predominantly sexual mode of transmission: Secondary | ICD-10-CM

## 2016-12-31 DIAGNOSIS — F17210 Nicotine dependence, cigarettes, uncomplicated: Secondary | ICD-10-CM | POA: Insufficient documentation

## 2016-12-31 MED ORDER — LIDOCAINE HCL (PF) 1 % IJ SOLN
INTRAMUSCULAR | Status: AC
  Filled 2016-12-31: qty 2

## 2016-12-31 MED ORDER — CEFTRIAXONE SODIUM 250 MG IJ SOLR
250.0000 mg | Freq: Once | INTRAMUSCULAR | Status: AC
  Administered 2016-12-31: 250 mg via INTRAMUSCULAR

## 2016-12-31 MED ORDER — CEFTRIAXONE SODIUM 250 MG IJ SOLR
INTRAMUSCULAR | Status: AC
  Filled 2016-12-31: qty 250

## 2016-12-31 MED ORDER — AZITHROMYCIN 250 MG PO TABS
1000.0000 mg | ORAL_TABLET | Freq: Once | ORAL | Status: AC
  Administered 2016-12-31: 1000 mg via ORAL

## 2016-12-31 MED ORDER — AZITHROMYCIN 250 MG PO TABS
ORAL_TABLET | ORAL | Status: AC
  Filled 2016-12-31: qty 4

## 2016-12-31 NOTE — ED Notes (Signed)
Dirty and clean urine samples obtained.

## 2016-12-31 NOTE — ED Triage Notes (Signed)
Pt here for STD check... She is asymptomatic... Partner is being seen today for penile d/c and she just wants to make sure she has nothing  A&O x4... NAD.Meredith Jackson. ambulatory
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
## 2016-12-31 NOTE — Discharge Instructions (Signed)
Treated you for exposure to STD. Check Urine today. Return if any symptoms. Practice safe sex.

## 2017-01-02 LAB — URINE CYTOLOGY ANCILLARY ONLY
Chlamydia: NEGATIVE
NEISSERIA GONORRHEA: NEGATIVE

## 2017-05-05 ENCOUNTER — Encounter (HOSPITAL_COMMUNITY): Payer: Self-pay | Admitting: *Deleted

## 2017-05-05 ENCOUNTER — Ambulatory Visit (HOSPITAL_COMMUNITY)
Admission: EM | Admit: 2017-05-05 | Discharge: 2017-05-05 | Disposition: A | Discharge status: Home / Self Care | Payer: Medicaid Other | Attending: Urgent Care | Admitting: Urgent Care

## 2017-05-05 DIAGNOSIS — L299 Pruritus, unspecified: Secondary | ICD-10-CM

## 2017-05-05 DIAGNOSIS — Z202 Contact with and (suspected) exposure to infections with a predominantly sexual mode of transmission: Secondary | ICD-10-CM

## 2017-05-05 DIAGNOSIS — L298 Other pruritus: Secondary | ICD-10-CM | POA: Diagnosis not present

## 2017-05-05 MED ORDER — PENICILLIN G BENZATHINE 1200000 UNIT/2ML IM SUSP
INTRAMUSCULAR | Status: AC
  Filled 2017-05-05: qty 4

## 2017-05-05 MED ORDER — PENICILLIN G BENZATHINE 1200000 UNIT/2ML IM SUSP
2.4000 10*6.[IU] | Freq: Once | INTRAMUSCULAR | Status: AC
  Administered 2017-05-05: 2.4 10*6.[IU] via INTRAMUSCULAR

## 2017-05-05 NOTE — ED Provider Notes (Signed)
    MRN: [id number] DOB: [birth year]  Subjective:   Meredith Jackson is a 31 y.o. female presenting for chief complaint of Exposure to STD  Reports 1 week history of itchy spots on her upper arm. She just moved into a new place and is worried that she has scabies or bed bugs. Denies fever, painful rash, drainage. Patient went to [place] with her boyfriend, had STI testing done there. Her boyfriend got tested today in [place] and was positive for syphilis.   Meredith Jackson has No Known Allergies.  Meredith Jackson denies past medical history. Also  has a past surgical history that includes Cesarean section.  Objective:   Vitals: BP 132/72 (BP Location: Right Arm)   Pulse 78   Temp 98.6 F (37 C) (Oral)   Resp 18   LMP 05/02/2017   SpO2 100%   Physical Exam  Constitutional: She is oriented to person, place, and time. She appears well-developed and well-nourished.  Cardiovascular: Normal rate.   Pulmonary/Chest: Effort normal.  Neurological: She is alert and oriented to person, place, and time.  Skin:     No palmar lesions or lesions over soles of feet.   Assessment and Plan :   STD exposure  Itching  Will treat empirically for syphilis due to exposure. Her test results from [place] done in 03/2017 were negative for syphilis. Follow up with [facility] for further recommendations. I reassured her that the 2 excoriations on her upper arm are likely not related and do not appear to be bed bug bites or scabies.   Jaynee Eagles, PA-C [facility]  05/05/2017  3:43 PM    Jaynee Eagles, PA-C 05/05/17 1625

## 2017-05-05 NOTE — ED Triage Notes (Signed)
Rash       Pt  States     She   Was  Told    By  Partner that  He  Had   An  Std       Needs   Treatment        Pt   Reports  Vaginal itching    Pt  Was  Told  Today

## 2017-09-12 ENCOUNTER — Ambulatory Visit (INDEPENDENT_AMBULATORY_CARE_PROVIDER_SITE_OTHER): Payer: Medicaid Other | Admitting: Obstetrics and Gynecology

## 2017-09-12 ENCOUNTER — Encounter: Payer: Self-pay | Admitting: Obstetrics and Gynecology

## 2017-09-12 DIAGNOSIS — Z3049 Encounter for surveillance of other contraceptives: Secondary | ICD-10-CM

## 2017-09-12 DIAGNOSIS — Z3046 Encounter for surveillance of implantable subdermal contraceptive: Secondary | ICD-10-CM

## 2017-09-12 HISTORY — DX: Encounter for surveillance of implantable subdermal contraceptive: Z30.46

## 2017-09-12 NOTE — Progress Notes (Signed)
Alyanna Stoermer is a 32 y.o. [id number] here for Nexplanon removal.   No other gynecologic concerns.   Nexplanon Removal Patient identified, informed consent performed, consent signed.   Appropriate time out taken. Nexplanon site identified.  Area prepped in usual sterile fashon. One ml of 1% lidocaine was used to anesthetize the area at the distal end of the implant. A small stab incision was made right beside the implant on the distal portion.  The Nexplanon rod was grasped using hemostats and removed without difficulty.  There was minimal blood loss. There were no complications.  3 ml of 1% lidocaine was injected around the incision for post-procedure analgesia.  Steri-strips were applied over the small incision.  A pressure bandage was applied to reduce any bruising.  The patient tolerated the procedure well and was given post procedure instructions.  Patient is planning to use condoms for contraception/attempt conception.  To follow up with yearly GYN exam  Chancy Milroy, MD, [place] Attending [place] for [facility], [place]

## 2018-04-30 ENCOUNTER — Other Ambulatory Visit: Payer: Self-pay

## 2018-04-30 ENCOUNTER — Emergency Department (HOSPITAL_COMMUNITY)
Admission: EM | Admit: 2018-04-30 | Discharge: 2018-04-30 | Disposition: A | Discharge status: Home / Self Care | Payer: Medicaid Other | Attending: Emergency Medicine | Admitting: Emergency Medicine

## 2018-04-30 ENCOUNTER — Encounter (HOSPITAL_COMMUNITY): Payer: Self-pay | Admitting: Emergency Medicine

## 2018-04-30 DIAGNOSIS — H5712 Ocular pain, left eye: Secondary | ICD-10-CM | POA: Diagnosis not present

## 2018-04-30 DIAGNOSIS — Z5321 Procedure and treatment not carried out due to patient leaving prior to being seen by health care provider: Secondary | ICD-10-CM | POA: Diagnosis not present
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
## 2018-05-02 ENCOUNTER — Encounter (HOSPITAL_COMMUNITY): Payer: Self-pay

## 2018-05-02 ENCOUNTER — Emergency Department (HOSPITAL_COMMUNITY)
Admission: EM | Admit: 2018-05-02 | Discharge: 2018-05-03 | Payer: Medicaid Other | Attending: Emergency Medicine | Admitting: Emergency Medicine

## 2018-05-02 DIAGNOSIS — F17210 Nicotine dependence, cigarettes, uncomplicated: Secondary | ICD-10-CM | POA: Diagnosis not present

## 2018-05-02 DIAGNOSIS — S0990XA Unspecified injury of head, initial encounter: Secondary | ICD-10-CM | POA: Diagnosis not present

## 2018-05-02 DIAGNOSIS — Y92143 Cell of prison as the place of occurrence of the external cause: Secondary | ICD-10-CM | POA: Insufficient documentation

## 2018-05-02 DIAGNOSIS — Y939 Activity, unspecified: Secondary | ICD-10-CM | POA: Diagnosis not present

## 2018-05-02 DIAGNOSIS — Y999 Unspecified external cause status: Secondary | ICD-10-CM | POA: Diagnosis not present

## 2018-05-02 DIAGNOSIS — S0101XA Laceration without foreign body of scalp, initial encounter: Secondary | ICD-10-CM | POA: Diagnosis present
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
## 2018-05-03 ENCOUNTER — Emergency Department (HOSPITAL_COMMUNITY): Payer: Medicaid Other

## 2018-05-03 MED ORDER — ACETAMINOPHEN 325 MG PO TABS
650.0000 mg | ORAL_TABLET | Freq: Once | ORAL | Status: AC
  Administered 2018-05-03: 650 mg via ORAL
  Filled 2018-05-03: qty 2

## 2018-05-03 MED ORDER — IBUPROFEN 800 MG PO TABS
800.0000 mg | ORAL_TABLET | Freq: Once | ORAL | Status: AC
  Administered 2018-05-03: 800 mg via ORAL
  Filled 2018-05-03: qty 1

## 2018-05-03 NOTE — Discharge Instructions (Signed)
It was my pleasure taking care of you today!  Please keep your laceration clean and dry. Wash with soap and water twice daily.   Return to ER for new or worsening symptoms, any additional concerns.

## 2018-05-03 NOTE — ED Notes (Signed)
Patient transported to CT 
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
## 2018-06-17 ENCOUNTER — Emergency Department (HOSPITAL_COMMUNITY)
Admission: EM | Admit: 2018-06-17 | Discharge: 2018-06-17 | Discharge status: Against Medical Advice | Payer: Medicaid Other | Attending: Emergency Medicine | Admitting: Emergency Medicine

## 2018-06-17 ENCOUNTER — Emergency Department (HOSPITAL_COMMUNITY)
Admission: EM | Admit: 2018-06-17 | Discharge: 2018-06-17 | Disposition: A | Discharge status: Home / Self Care | Payer: Medicaid Other | Source: Home / Self Care | Attending: Emergency Medicine | Admitting: Emergency Medicine

## 2018-06-17 ENCOUNTER — Other Ambulatory Visit: Payer: Self-pay

## 2018-06-17 ENCOUNTER — Encounter (HOSPITAL_COMMUNITY): Payer: Self-pay

## 2018-06-17 DIAGNOSIS — T40601A Poisoning by unspecified narcotics, accidental (unintentional), initial encounter: Secondary | ICD-10-CM | POA: Insufficient documentation

## 2018-06-17 DIAGNOSIS — R4182 Altered mental status, unspecified: Secondary | ICD-10-CM | POA: Insufficient documentation

## 2018-06-17 DIAGNOSIS — F1910 Other psychoactive substance abuse, uncomplicated: Secondary | ICD-10-CM

## 2018-06-17 DIAGNOSIS — R110 Nausea: Secondary | ICD-10-CM

## 2018-06-17 DIAGNOSIS — R112 Nausea with vomiting, unspecified: Secondary | ICD-10-CM

## 2018-06-17 DIAGNOSIS — Z5320 Procedure and treatment not carried out because of patient's decision for unspecified reasons: Secondary | ICD-10-CM | POA: Diagnosis not present

## 2018-06-17 DIAGNOSIS — F17210 Nicotine dependence, cigarettes, uncomplicated: Secondary | ICD-10-CM | POA: Insufficient documentation

## 2018-06-17 MED ORDER — ONDANSETRON 8 MG PO TBDP
8.0000 mg | ORAL_TABLET | Freq: Once | ORAL | Status: AC
  Administered 2018-06-17: 8 mg via ORAL
  Filled 2018-06-17: qty 1

## 2018-06-17 MED ORDER — ONDANSETRON HCL 4 MG PO TABS: 4.0000 mg | ORAL_TABLET | Freq: Four times a day (QID) | ORAL | 0 refills | Status: DC

## 2018-06-17 NOTE — ED Notes (Signed)
Tried to ambulate patient to bathroom but she reported that she felt lightheaded and shaky. Used wheelchair to transport patient to and from bathroom.

## 2018-06-17 NOTE — ED Notes (Signed)
She is awake and is eating a snack. I get her a sandwich per her request at this time. Will d/c soon.

## 2018-06-17 NOTE — ED Provider Notes (Signed)
[place] DEPT Provider Note   CSN: [id number] Arrival date & time: 06/17/18  0216     History   Chief Complaint Chief Complaint  Patient presents with  . Drug Overdose    HPI Meredith Jackson is a 32 y.o. female.  Patient brought to the emergency department by EMS after unintentional overdose.  Patient reports that she took "just a taste" of an unknown substance.  She thought that she was snorting cocaine but reports that "obviously it was not".  Patient became somnolent, was administered Narcan prehospital and now is awake but sleepy.  Patient denies intentional overdose, suicidal ideation.     Past Medical History:  Diagnosis Date  . Anxiety   . Depression     Patient Active Problem List   Diagnosis Date Noted  . Encounter for Nexplanon removal 09/12/2017    Past Surgical History:  Procedure Laterality Date  . CESAREAN SECTION     x2     OB History    Gravida  3   Para  3   Term  1   Preterm  2   AB      Living  2     SAB      TAB      Ectopic      Multiple      Live Births  2            Home Medications    Prior to Admission medications   Medication Sig Start Date End Date Taking? Authorizing Provider  etonogestrel (IMPLANON) 68 MG IMPL implant Inject 1 each into the skin once.    [provider]    Family History Family History  Problem Relation Age of Onset  . Hyperthyroidism Mother   . Diabetes Mother     Social History Social History   Tobacco Use  . Smoking status: Current Every Day Smoker    Packs/day: 0.50    Types: Cigarettes  . Smokeless tobacco: Never Used  Substance Use Topics  . Alcohol use: Yes    Comment: Occasional  . Drug use: No     Allergies   Patient has no known allergies.   Review of Systems Review of Systems  Psychiatric/Behavioral: Negative for suicidal ideas.  All other systems reviewed and are negative.    Physical Exam Updated Vital  Signs SpO2 96%   Physical Exam  Constitutional: She is oriented to person, place, and time. She appears well-developed and well-nourished. No distress.  HENT:  Head: Normocephalic and atraumatic.  Right Ear: Hearing normal.  Left Ear: Hearing normal.  Nose: Nose normal.  Mouth/Throat: Oropharynx is clear and moist and mucous membranes are normal.  Eyes: Pupils are equal, round, and reactive to light. Conjunctivae and EOM are normal.  Neck: Normal range of motion. Neck supple.  Cardiovascular: Regular rhythm, S1 normal and S2 normal. Exam reveals no gallop and no friction rub.  No murmur heard. Pulmonary/Chest: Effort normal and breath sounds normal. No respiratory distress. She exhibits no tenderness.  Abdominal: Soft. Normal appearance and bowel sounds are normal. There is no hepatosplenomegaly. There is no tenderness. There is no rebound, no guarding, no tenderness at McBurney's point and negative Murphy's sign. No hernia.  Musculoskeletal: Normal range of motion.  Neurological: She is alert and oriented to person, place, and time. She has normal strength. No cranial nerve deficit or sensory deficit. Coordination normal. GCS eye subscore is 4. GCS verbal subscore is 5. GCS motor subscore is  6.  Skin: Skin is warm, dry and intact. No rash noted. No cyanosis.  Psychiatric: She has a normal mood and affect. Her speech is normal and behavior is normal. Thought content normal.  Nursing note and vitals reviewed.    ED Treatments / Results  Labs (all labs ordered are listed, but only abnormal results are displayed) Labs Reviewed - No data to display  EKG None  Radiology No results found.  Procedures Procedures (including critical care time)  Medications Ordered in ED Medications - No data to display   Initial Impression / Assessment and Plan / ED Course  I have reviewed the triage vital signs and the nursing notes.  Pertinent labs & imaging results that were available during  my care of the patient were reviewed by me and considered in my medical decision making (see chart for details).     Patient reportedly became somnolent prior to arrival in the ER, but was administered an injection of Narcan before coming to the ER.  At arrival she was slightly sleepy but awake, alert and oriented.  Patient confirms that she thought she was doing cocaine and did not have any intention of harming herself.  Patient has normal neurologic function at arrival.  She is alert and oriented, has capacity to make decisions for herself.  I did thoroughly evaluate her while she was still on the EMS stretcher waiting to be roomed.  When she was brought out to triage, she demanded to leave and left [place].  Final Clinical Impressions(s) / ED Diagnoses   Final diagnoses:  Opiate overdose, accidental or unintentional, initial encounter [facility])    ED Discharge Orders    None       Betsey Holiday Gwenyth Allegra, MD 06/17/18 [phone]

## 2018-06-17 NOTE — ED Triage Notes (Addendum)
Pt BIB [facility]. They states that pt OD'd on something and was given 2 mg Narcan and came to. Per [facility], she endorses cocaine and ETOH. Pt does not want to be seen any further and will not comply with vitals. She is currently calling a ride to take her home.

## 2018-06-17 NOTE — ED Notes (Signed)
Bed: WLPT2 Expected date:  Expected time:  Means of arrival:  Comments: 

## 2018-06-17 NOTE — ED Triage Notes (Signed)
Pt returns. She reports that she is nauseous and "something wasn't right with the drugs she took." She states that she now wants to comply with medical treatment.

## 2018-06-17 NOTE — ED Provider Notes (Signed)
[place] DEPT Provider Note   CSN: [id number] Arrival date & time: 06/17/18  0413     History   Chief Complaint No chief complaint on file.   HPI Meredith Jackson is a 32 y.o. female.  Patient returns to the emergency department only a short time after leaving [place].  Patient seen previously for overdose of an unknown substance by snorting.  She received Narcan prehospital and appeared to be awake and alert during her evaluation.  She did not want to be evaluated any further and left after my evaluation.  Patient reports that since she left the hospital, however, she has become shaky and nauseated.     Past Medical History:  Diagnosis Date  . Anxiety   . Depression     Patient Active Problem List   Diagnosis Date Noted  . Encounter for Nexplanon removal 09/12/2017    Past Surgical History:  Procedure Laterality Date  . CESAREAN SECTION     x2     OB History    Gravida  3   Para  3   Term  1   Preterm  2   AB      Living  2     SAB      TAB      Ectopic      Multiple      Live Births  2            Home Medications    Prior to Admission medications   Not on File    Family History Family History  Problem Relation Age of Onset  . Hyperthyroidism Mother   . Diabetes Mother     Social History Social History   Tobacco Use  . Smoking status: Current Every Day Smoker    Packs/day: 0.50    Types: Cigarettes  . Smokeless tobacco: Never Used  Substance Use Topics  . Alcohol use: Yes    Comment: Occasional  . Drug use: No     Allergies   Patient has no known allergies.   Review of Systems Review of Systems  Gastrointestinal: Positive for nausea.  All other systems reviewed and are negative.    Physical Exam Updated Vital Signs BP 109/72   Pulse 72   Resp 16   Ht 5\' 2"  (1.575 m)   Wt 86.2 kg   SpO2 100%   BMI 34.75 kg/m   Physical Exam  Constitutional: She is  oriented to person, place, and time. She appears well-developed and well-nourished. No distress.  HENT:  Head: Normocephalic and atraumatic.  Right Ear: Hearing normal.  Left Ear: Hearing normal.  Nose: Nose normal.  Mouth/Throat: Oropharynx is clear and moist and mucous membranes are normal.  Eyes: Pupils are equal, round, and reactive to light. Conjunctivae and EOM are normal.  Neck: Normal range of motion. Neck supple.  Cardiovascular: Regular rhythm, S1 normal and S2 normal. Exam reveals no gallop and no friction rub.  No murmur heard. Pulmonary/Chest: Effort normal and breath sounds normal. No respiratory distress. She exhibits no tenderness.  Abdominal: Soft. Normal appearance and bowel sounds are normal. There is no hepatosplenomegaly. There is no tenderness. There is no rebound, no guarding, no tenderness at McBurney's point and negative Murphy's sign. No hernia.  Musculoskeletal: Normal range of motion.  Neurological: She is alert and oriented to person, place, and time. She has normal strength. No cranial nerve deficit or sensory deficit. Coordination normal. GCS eye subscore is 4.  GCS verbal subscore is 5. GCS motor subscore is 6.  Skin: Skin is warm, dry and intact. No rash noted. No cyanosis.  Psychiatric: She has a normal mood and affect. Her speech is normal and behavior is normal. Thought content normal.  Nursing note and vitals reviewed.    ED Treatments / Results  Labs (all labs ordered are listed, but only abnormal results are displayed) Labs Reviewed - No data to display  EKG None  Radiology No results found.  Procedures Procedures (including critical care time)  Medications Ordered in ED Medications  ondansetron (ZOFRAN-ODT) disintegrating tablet 8 mg (8 mg Oral Given 06/17/18 0524)     Initial Impression / Assessment and Plan / ED Course  I have reviewed the triage vital signs and the nursing notes.  Pertinent labs & imaging results that were available  during my care of the patient were reviewed by me and considered in my medical decision making (see chart for details).    Patient presents once again after unintentional overdose of an unknown drug.  Patient left AGAINST MEDICAL ADVICE earlier tonight.  She returned because of nausea and feeling shaky.  Patient has been monitored and has done well.  She was administered Zofran for her nausea, no vomiting.  Final Clinical Impressions(s) / ED Diagnoses   Final diagnoses:  Non-intractable vomiting with nausea, unspecified vomiting type    ED Discharge Orders    None       Orpah Greek, MD 06/17/18 [phone]

## 2018-06-17 NOTE — ED Notes (Signed)
Pt ambulated out of ED AMA.

## 2018-06-17 NOTE — ED Notes (Signed)
Pt refused to sign.  

## 2019-06-05 ENCOUNTER — Ambulatory Visit: Payer: Medicaid Other | Admitting: Obstetrics and Gynecology

## 2019-06-05 ENCOUNTER — Other Ambulatory Visit: Payer: Self-pay

## 2019-06-05 ENCOUNTER — Encounter: Payer: Self-pay | Admitting: Obstetrics and Gynecology

## 2019-06-05 ENCOUNTER — Other Ambulatory Visit (HOSPITAL_COMMUNITY)
Admission: RE | Admit: 2019-06-05 | Discharge: 2019-06-05 | Disposition: A | Discharge status: Home / Self Care | Payer: Medicaid Other | Source: Ambulatory Visit | Attending: Obstetrics and Gynecology | Admitting: Obstetrics and Gynecology

## 2019-06-05 DIAGNOSIS — R350 Frequency of micturition: Secondary | ICD-10-CM | POA: Insufficient documentation

## 2019-06-05 DIAGNOSIS — Z01419 Encounter for gynecological examination (general) (routine) without abnormal findings: Secondary | ICD-10-CM | POA: Diagnosis not present

## 2019-06-05 DIAGNOSIS — Z0000 Encounter for general adult medical examination without abnormal findings: Secondary | ICD-10-CM | POA: Diagnosis not present

## 2019-06-05 DIAGNOSIS — Z202 Contact with and (suspected) exposure to infections with a predominantly sexual mode of transmission: Secondary | ICD-10-CM | POA: Insufficient documentation

## 2019-06-05 NOTE — Progress Notes (Signed)
Patient ID: Camiyah Stophel, female   DOB: [birth year], 33 y.o.   MRN: [id number]  Yuridia Upp is a 33 y.o. [id number] female here for a routine annual gynecologic exam.  Current complaints of some urinary freq over the last few weeks.   Denies abnormal vaginal bleeding, discharge, pelvic pain, problems with intercourse or other gynecologic concerns.    Gynecologic History Patient's last menstrual period was 06/02/2019. Contraception: none  Obstetric History OB History  Gravida Para Term Preterm AB Living  3 3 1 2   2   SAB TAB Ectopic Multiple Live Births          2    # Outcome Date GA Lbr Len/2nd Weight Sex Delivery Anes PTL Lv  3 Preterm 03/09/09    F CS-LTranv        Birth Comments: repeat c/s was done early   2 Preterm 08/22/06    F CS-LTranv        Birth Comments: 26wks  1 Term 01/09/00    [address]   [place]    Past Medical History:  Diagnosis Date  . Anxiety   . Depression   . Encounter for Nexplanon removal 09/12/2017    Past Surgical History:  Procedure Laterality Date  . CESAREAN SECTION     x2    No current outpatient medications on file prior to visit.   No current facility-administered medications on file prior to visit.     No Known Allergies  Social History   Socioeconomic History  . Marital status: Single    Spouse name: Not on file  . Number of children: Not on file  . Years of education: Not on file  . Highest education level: Not on file  Occupational History  . Not on file  Social Needs  . Financial resource strain: Not on file  . Food insecurity    Worry: Not on file    Inability: Not on file  . Transportation needs    Medical: Not on file    Non-medical: Not on file  Tobacco Use  . Smoking status: Current Every Day Smoker    Packs/day: 0.50    Types: Cigarettes  . Smokeless tobacco: Never Used  Substance and Sexual Activity  . Alcohol use: Yes    Comment: Occasional  . Drug use: No  . Sexual activity: Not Currently    Partners: Male   Birth control/protection: Implant  Lifestyle  . Physical activity    Days per week: Not on file    Minutes per session: Not on file  . Stress: Not on file  Relationships  . Social Herbalist on phone: Not on file    Gets together: Not on file    Attends religious service: Not on file    Active member of club or organization: Not on file    Attends meetings of clubs or organizations: Not on file    Relationship status: Not on file  . Intimate partner violence    Fear of current or ex partner: Not on file    Emotionally abused: Not on file    Physically abused: Not on file    Forced sexual activity: Not on file  Other Topics Concern  . Not on file  Social History Narrative  . Not on file    Family History  Problem Relation Age of Onset  . Hyperthyroidism Mother   . Diabetes Mother     The following portions of the patient's history were  reviewed and updated as appropriate: allergies, current medications, past family history, past medical history, past social history, past surgical history and problem list.  Review of Systems Pertinent items noted in HPI and remainder of comprehensive ROS otherwise negative.   Objective:  Pulse 76   Temp (!) 97.1 F (36.2 C)   Ht 5\' 3"  (1.6 m)   Wt 186 lb 3.2 oz (84.5 kg)   LMP 06/02/2019   BMI 32.98 kg/m  CONSTITUTIONAL: Well-developed, well-nourished female in no acute distress.  HENT:  Normocephalic, atraumatic, External right and left ear normal. Oropharynx is clear and moist EYES: Conjunctivae and EOM are normal. Pupils are equal, round, and reactive to light. No scleral icterus.  NECK: Normal range of motion, supple, no masses.  Normal thyroid.  SKIN: Skin is warm and dry. No rash noted. Not diaphoretic. No erythema. No pallor. [place]: Alert and oriented to person, place, and time. Normal reflexes, muscle tone coordination. No cranial nerve deficit noted. PSYCHIATRIC: Normal mood and affect. Normal behavior. Normal  judgment and thought content. CARDIOVASCULAR: Normal heart rate noted, regular rhythm RESPIRATORY: Clear to auscultation bilaterally. Effort and breath sounds normal, no problems with respiration noted. BREASTS: Symmetric in size. No masses, skin changes, nipple drainage, or lymphadenopathy. ABDOMEN: Soft, normal bowel sounds, no distention noted.  No tenderness, rebound or guarding.  PELVIC: Normal appearing external genitalia; normal appearing vaginal mucosa and cervix.  No abnormal discharge noted.  Pap smear obtained.  Normal uterine size, no other palpable masses, no uterine or adnexal tenderness. MUSCULOSKELETAL: Normal range of motion. No tenderness.  No cyanosis, clubbing, or edema.  2+ distal pulses.   Assessment:  Annual gynecologic examination with pap smear  STD testing Urinary Frequency Plan:  Will follow up results of pap smear and manage accordingly. Will check UC and STD testing. Pt declined flu vaccine Routine preventative health maintenance measures emphasized. Please refer to After Visit Summary for other counseling recommendations.    Chancy Milroy, MD, [place] Attending [place] for [facility], [place]

## 2019-06-05 NOTE — Patient Instructions (Signed)
Health Maintenance, Female Adopting a healthy lifestyle and getting preventive care are important in promoting health and wellness. Ask your health care provider about:  The right schedule for you to have regular tests and exams.  Things you can do on your own to prevent diseases and keep yourself healthy. What should I know about diet, weight, and exercise? Eat a healthy diet   Eat a diet that includes plenty of vegetables, fruits, low-fat dairy products, and lean protein.  Do not eat a lot of foods that are high in solid fats, added sugars, or sodium. Maintain a healthy weight Body mass index (BMI) is used to identify weight problems. It estimates body fat based on height and weight. Your health care provider can help determine your BMI and help you achieve or maintain a healthy weight. Get regular exercise Get regular exercise. This is one of the most important things you can do for your health. Most adults should:  Exercise for at least 150 minutes each week. The exercise should increase your heart rate and make you sweat (moderate-intensity exercise).  Do strengthening exercises at least twice a week. This is in addition to the moderate-intensity exercise.  Spend less time sitting. Even light physical activity can be beneficial. Watch cholesterol and blood lipids Have your blood tested for lipids and cholesterol at 33 years of age, then have this test every 5 years. Have your cholesterol levels checked more often if:  Your lipid or cholesterol levels are high.  You are older than 33 years of age.  You are at high risk for heart disease. What should I know about cancer screening? Depending on your health history and family history, you may need to have cancer screening at various ages. This may include screening for:  Breast cancer.  Cervical cancer.  Colorectal cancer.  Skin cancer.  Lung cancer. What should I know about heart disease, diabetes, and high blood  pressure? Blood pressure and heart disease  High blood pressure causes heart disease and increases the risk of stroke. This is more likely to develop in people who have high blood pressure readings, are of African descent, or are overweight.  Have your blood pressure checked: ? Every 3-5 years if you are 18-39 years of age. ? Every year if you are 40 years old or older. Diabetes Have regular diabetes screenings. This checks your fasting blood sugar level. Have the screening done:  Once every three years after age 40 if you are at a normal weight and have a low risk for diabetes.  More often and at a younger age if you are overweight or have a high risk for diabetes. What should I know about preventing infection? Hepatitis B If you have a higher risk for hepatitis B, you should be screened for this virus. Talk with your health care provider to find out if you are at risk for hepatitis B infection. Hepatitis C Testing is recommended for:  Everyone born from 1945 through 1965.  Anyone with known risk factors for hepatitis C. Sexually transmitted infections (STIs)  Get screened for STIs, including gonorrhea and chlamydia, if: ? You are sexually active and are younger than 33 years of age. ? You are older than 33 years of age and your health care provider tells you that you are at risk for this type of infection. ? Your sexual activity has changed since you were last screened, and you are at increased risk for chlamydia or gonorrhea. Ask your health care provider if   you are at risk.  Ask your health care provider about whether you are at high risk for HIV. Your health care provider may recommend a prescription medicine to help prevent HIV infection. If you choose to take medicine to prevent HIV, you should first get tested for HIV. You should then be tested every 3 months for as long as you are taking the medicine. Pregnancy  If you are about to stop having your period (premenopausal) and  you may become pregnant, seek counseling before you get pregnant.  Take 400 to 800 micrograms (mcg) of folic acid every day if you become pregnant.  Ask for birth control (contraception) if you want to prevent pregnancy. Osteoporosis and menopause Osteoporosis is a disease in which the bones lose minerals and strength with aging. This can result in bone fractures. If you are 65 years old or older, or if you are at risk for osteoporosis and fractures, ask your health care provider if you should:  Be screened for bone loss.  Take a calcium or vitamin D supplement to lower your risk of fractures.  Be given hormone replacement therapy (HRT) to treat symptoms of menopause. Follow these instructions at home: Lifestyle  Do not use any products that contain nicotine or tobacco, such as cigarettes, e-cigarettes, and chewing tobacco. If you need help quitting, ask your health care provider.  Do not use street drugs.  Do not share needles.  Ask your health care provider for help if you need support or information about quitting drugs. Alcohol use  Do not drink alcohol if: ? Your health care provider tells you not to drink. ? You are pregnant, may be pregnant, or are planning to become pregnant.  If you drink alcohol: ? Limit how much you use to 0-1 drink a day. ? Limit intake if you are breastfeeding.  Be aware of how much alcohol is in your drink. In the [place] one drink equals one 12 oz bottle of beer (355 mL), one 5 oz glass of wine (148 mL), or one 1 oz glass of hard liquor (44 mL). General instructions  Schedule regular health, dental, and eye exams.  Stay current with your vaccines.  Tell your health care provider if: ? You often feel depressed. ? You have ever been abused or do not feel safe at home. Summary  Adopting a healthy lifestyle and getting preventive care are important in promoting health and wellness.  Follow your health care provider's instructions about healthy  diet, exercising, and getting tested or screened for diseases.  Follow your health care provider's instructions on monitoring your cholesterol and blood pressure. This information is not intended to replace advice given to you by your health care provider. Make sure you discuss any questions you have with your health care provider. Document Released: 02/14/2011 Document Revised: 07/25/2018 Document Reviewed: 07/25/2018 Elsevier Patient Education  2020 Elsevier Inc.  

## 2019-06-05 NOTE — Progress Notes (Signed)
GYN presents for AEX/PAP/STD testing.  Last PAP more than 3 years ago.  Declined Flu vaccine.  She is not using BC.

## 2019-06-06 LAB — HIV ANTIBODY (ROUTINE TESTING W REFLEX): HIV Screen 4th Generation wRfx: NONREACTIVE

## 2019-06-06 LAB — HEPATITIS C ANTIBODY: Hep C Virus Ab: <0.1 s/co ratio (ref 0.0–0.9)

## 2019-06-06 LAB — HEPATITIS B CORE ANTIBODY, TOTAL: Hep B Core Total Ab: NEGATIVE

## 2019-06-06 LAB — RPR: RPR Ser Ql: NONREACTIVE

## 2019-06-07 LAB — CYTOLOGY - PAP
Comment: NEGATIVE
Diagnosis: NEGATIVE
High risk HPV: NEGATIVE

## 2019-06-07 LAB — URINE CULTURE

## 2019-06-13 LAB — CERVICOVAGINAL ANCILLARY ONLY
Bacterial Vaginitis (gardnerella): NEGATIVE
Candida Glabrata: NEGATIVE
Candida Vaginitis: NEGATIVE
Chlamydia: NEGATIVE
Comment: NEGATIVE
Comment: NEGATIVE
Comment: NEGATIVE
Comment: NEGATIVE
Comment: NEGATIVE
Comment: NORMAL
Neisseria Gonorrhea: NEGATIVE
Trichomonas: POSITIVE — AB

## 2019-06-17 ENCOUNTER — Telehealth: Payer: Self-pay

## 2019-06-17 ENCOUNTER — Other Ambulatory Visit: Payer: Self-pay

## 2019-06-17 NOTE — Telephone Encounter (Signed)
-----   Message from Chancy Milroy, MD sent at 06/15/2019  9:16 AM EDT ----- Please let pt know that she has tric, other STD testing negative Flagyl 500 mg po bid x 7 days Advise to have partner evaluated and treated as well Reframe from intercourse until TOC TOC in 3-4 weeks  Thanks Legrand Como

## 2019-06-17 NOTE — Progress Notes (Unsigned)
Left VM message to call Office for Triage Nurse.  Need to know her preferred Pharmacy.

## 2019-06-17 NOTE — Telephone Encounter (Signed)
Left VM message to call Office. Need her preferred Pharmacy

## 2019-06-17 NOTE — Telephone Encounter (Signed)
3rd call: Mailbox is full, unable to leave VM message.  Activation code sent to sign up for MyChart.

## 2019-06-18 ENCOUNTER — Telehealth: Payer: Self-pay

## 2019-06-18 NOTE — Telephone Encounter (Signed)
-----   Message from Chancy Milroy, MD sent at 06/15/2019  9:16 AM EDT ----- Please let pt know that she has tric, other STD testing negative Flagyl 500 mg po bid x 7 days Advise to have partner evaluated and treated as well Reframe from intercourse until TOC TOC in 3-4 weeks  Thanks Legrand Como

## 2019-06-18 NOTE — Telephone Encounter (Signed)
  Patients Mailbox is full. Unable to leave message.  MyChart Activation code sent

## 2019-06-19 ENCOUNTER — Telehealth: Payer: Self-pay

## 2019-06-19 ENCOUNTER — Other Ambulatory Visit: Payer: Self-pay

## 2019-06-19 MED ORDER — METRONIDAZOLE 500 MG PO TABS: 500.0000 mg | ORAL_TABLET | Freq: Two times a day (BID) | ORAL | 0 refills | Status: AC

## 2019-06-19 NOTE — Progress Notes (Signed)
RX sent. Patient notified of results and Rx.

## 2019-06-19 NOTE — Telephone Encounter (Signed)
  Notified pt of results and RX, abstain from sex, partner to be treated and TOC in 3-4 weeks.

## 2019-06-19 NOTE — Telephone Encounter (Signed)
-----   Message from Chancy Milroy, MD sent at 06/15/2019  9:16 AM EDT ----- Please let pt know that she has tric, other STD testing negative Flagyl 500 mg po bid x 7 days Advise to have partner evaluated and treated as well Reframe from intercourse until TOC TOC in 3-4 weeks  Thanks Legrand Como

## 2019-09-01 ENCOUNTER — Encounter (HOSPITAL_COMMUNITY): Payer: Self-pay | Admitting: Pharmacy Technician

## 2019-09-01 ENCOUNTER — Other Ambulatory Visit: Payer: Self-pay

## 2019-09-01 ENCOUNTER — Emergency Department (HOSPITAL_COMMUNITY)
Admission: EM | Admit: 2019-09-01 | Discharge: 2019-09-01 | Disposition: A | Discharge status: Home / Self Care | Payer: Medicaid Other | Attending: Emergency Medicine | Admitting: Emergency Medicine

## 2019-09-01 DIAGNOSIS — Z3201 Encounter for pregnancy test, result positive: Secondary | ICD-10-CM | POA: Insufficient documentation

## 2019-09-01 DIAGNOSIS — F17210 Nicotine dependence, cigarettes, uncomplicated: Secondary | ICD-10-CM | POA: Diagnosis not present

## 2019-09-01 DIAGNOSIS — Z202 Contact with and (suspected) exposure to infections with a predominantly sexual mode of transmission: Secondary | ICD-10-CM | POA: Insufficient documentation

## 2019-09-01 DIAGNOSIS — L292 Pruritus vulvae: Secondary | ICD-10-CM | POA: Diagnosis not present

## 2019-09-01 DIAGNOSIS — Z113 Encounter for screening for infections with a predominantly sexual mode of transmission: Secondary | ICD-10-CM

## 2019-09-01 LAB — URINALYSIS, ROUTINE W REFLEX MICROSCOPIC
Bilirubin Urine: NEGATIVE
Glucose, UA: NEGATIVE mg/dL
Hgb urine dipstick: NEGATIVE
Ketones, ur: 20 mg/dL — AB
Leukocytes,Ua: NEGATIVE
Nitrite: NEGATIVE
Protein, ur: NEGATIVE mg/dL
Specific Gravity, Urine: 1.012 (ref 1.005–1.030)
pH: 7 (ref 5.0–8.0)

## 2019-09-01 LAB — WET PREP, GENITAL
Clue Cells Wet Prep HPF POC: NONE SEEN
Sperm: NONE SEEN
Trich, Wet Prep: NONE SEEN
Yeast Wet Prep HPF POC: NONE SEEN

## 2019-09-01 LAB — POC URINE PREG, ED: Preg Test, Ur: POSITIVE — AB

## 2019-09-01 NOTE — ED Provider Notes (Signed)
[place] EMERGENCY DEPARTMENT Provider Note   CSN: [id number] Arrival date & time: 09/01/19  1605     History No chief complaint on file.   Meredith Jackson is a 34 y.o. female with history of anxiety and depression presents for evaluation of pregnancy and request for STD check.  She reports that she was seen and evaluated by her OB/GYN Dr. Laurena Bering on 06/05/2019 for a well visit/Pap smear.  At that time she notes that she was menstruating.  She received a phone call on 06/15/2019 noting that she had tested positive for trichomonas and a prescription for Flagyl 500 mg twice daily was sent to her pharmacy for her and her sexual partner.  She reports that she began taking the medication on 06/16/2019 and was sexually active with her significant other around 06/23/2019.  She reports that she has not had a menstrual cycle since 06/05/2019.  She took a pregnancy test earlier this week because she noticed that she had not had a cycle in a few months.  This was positive.  She is concerned that the Flagyl that she took at the time that the fetus was presumably conceived could have been harmful to the fetus.  She is requesting repeat STD testing as well.  She denies any symptoms of STD including genital lesion, vaginal discharge, pelvic pain, nausea or vomiting.  No fevers.  She does note occasional itching around her labia but states this has been a persistent issue and is unchanged.  She has not had any new sexual partners.  She did purchase prenatal vitamins  The history is provided by the patient.       Past Medical History:  Diagnosis Date  . Anxiety   . Depression   . Encounter for Nexplanon removal 09/12/2017    Patient Active Problem List   Diagnosis Date Noted  . Well female exam with routine gynecological exam 06/05/2019  . Exposure to STD 06/05/2019  . Urinary frequency 06/05/2019    Past Surgical History:  Procedure Laterality Date  . CESAREAN SECTION     x2      OB History    Gravida  3   Para  3   Term  1   Preterm  2   AB      Living  2     SAB      TAB      Ectopic      Multiple      Live Births  2           Family History  Problem Relation Age of Onset  . Hyperthyroidism Mother   . Diabetes Mother     Social History   Tobacco Use  . Smoking status: Current Every Day Smoker    Packs/day: 0.50    Types: Cigarettes  . Smokeless tobacco: Never Used  Substance Use Topics  . Alcohol use: Yes    Comment: Occasional  . Drug use: No    Home Medications Prior to Admission medications   Not on File    Allergies    Patient has no known allergies.  Review of Systems   Review of Systems  Constitutional: Negative for chills and fever.  Respiratory: Negative for shortness of breath.   Cardiovascular: Negative for chest pain.  Gastrointestinal: Negative for abdominal pain, nausea and vomiting.  Genitourinary: Negative for dysuria, hematuria, vaginal bleeding, vaginal discharge and vaginal pain.  Musculoskeletal: Negative for back pain.  All other systems reviewed and  are negative.   Physical Exam Updated Vital Signs BP 122/85 (BP Location: Left Arm)   Pulse 68   Temp 98.6 F (37 C) (Oral)   Resp 16   LMP 06/30/2019   SpO2 100%   Physical Exam Vitals and nursing note reviewed.  Constitutional:      General: She is not in acute distress.    Appearance: She is well-developed.  HENT:     Head: Normocephalic and atraumatic.  Eyes:     General:        Right eye: No discharge.        Left eye: No discharge.     Conjunctiva/sclera: Conjunctivae normal.  Neck:     Vascular: No JVD.     Trachea: No tracheal deviation.  Cardiovascular:     Rate and Rhythm: Normal rate.  Pulmonary:     Effort: Pulmonary effort is normal.  Abdominal:     General: Bowel sounds are normal. There is no distension.     Palpations: Abdomen is soft.     Tenderness: There is no abdominal tenderness. There is no right  CVA tenderness, left CVA tenderness, guarding or rebound.  Skin:    General: Skin is warm and dry.     Findings: No erythema.  Neurological:     Mental Status: She is alert.  Psychiatric:        Behavior: Behavior normal.     ED Results / Procedures / Treatments   Labs (all labs ordered are listed, but only abnormal results are displayed) Labs Reviewed  WET PREP, GENITAL - Abnormal; Notable for the following components:      Result Value   WBC, Wet Prep HPF POC FEW (*)    All other components within normal limits  URINALYSIS, ROUTINE W REFLEX MICROSCOPIC - Abnormal; Notable for the following components:   Ketones, ur 20 (*)    All other components within normal limits  POC URINE PREG, ED - Abnormal; Notable for the following components:   Preg Test, Ur POSITIVE (*)    All other components within normal limits  URINE CULTURE  GC/CHLAMYDIA PROBE AMP ([place]) NOT AT [facility]    EKG None  Radiology No results found.  Procedures Procedures (including critical care time)  Medications Ordered in ED Medications - No data to display  ED Course  I have reviewed the triage vital signs and the nursing notes.  Pertinent labs & imaging results that were available during my care of the patient were reviewed by me and considered in my medical decision making (see chart for details).    MDM Rules/Calculators/A&P                      Patient presenting requesting STD testing though she has no symptoms.  She is afebrile, vital signs are stable.  She is nontoxic in appearance.  Abdomen is soft and nontender.  No back pain or CVA tenderness.  No vaginal bleeding.  She has not had a period since October 2020.  Her point-of-care urine pregnancy test is positive.  In the absence of any concerning findings I do not feel she requires emergent ultrasound in the ED today.  Given she is not having any symptoms of STD she will self swab and we will obtain a UA to rule out asymptomatic  bacteriuria.  Doubt acute surgical abdominal pathology, PID, ovarian torsion, TOA, or ectopic pregnancy.  6:15PM Patient reports that she has to go and pick up  her significant other from work.  Her wet prep and UA have not resulted yet.  I will call her if any of her results are abnormal and send the appropriate treatment to her pharmacy.  We discussed follow-up with her OB/GYN on an outpatient basis for reevaluation.  Also discussed initiating prenatal vitamins.  Discussed strict ED return precautions. Pt verbalized understanding of and agreement with plan and is safe for discharge home at this time.   7:01 PM Prep does not show any evidence of BV or yeast.  UA does not suggest UTI or asymptomatic bacteriuria.  Final Clinical Impression(s) / ED Diagnoses Final diagnoses:  Screening for STD (sexually transmitted disease)  Positive pregnancy test    Rx / DC Orders ED Discharge Orders    None       Debroah Baller 09/01/19 1901    Tegeler, Gwenyth Allegra, MD 09/01/19 2127

## 2019-09-01 NOTE — ED Notes (Addendum)
Pt denies pain and bleeding

## 2019-09-01 NOTE — ED Triage Notes (Signed)
Pt states she took a home pregnancy two days ago that was positive. LMP 06/30/2019. Pt states she took a STD medication before she knew she was pregnant. Pt states she looked up the medication and it could be harmful to the fetus. Pt also requesting STD recheck.

## 2019-09-01 NOTE — Discharge Instructions (Addendum)
Start taking your prenatal vitamins.  I would recommend that you do not drink or smoke while pregnant.  Call of the OB/GYN's office this week to schedule close follow-up and for confirmatory ultrasound.  Return to the emergency department if any concerning signs or symptoms develop.  I will call you if any of your results from today are abnormal.

## 2019-09-02 LAB — URINE CULTURE

## 2019-09-02 LAB — GC/CHLAMYDIA PROBE AMP (~~LOC~~) NOT AT ARMC
Chlamydia: NEGATIVE
Neisseria Gonorrhea: NEGATIVE

## 2019-09-06 ENCOUNTER — Other Ambulatory Visit: Payer: Self-pay

## 2019-09-06 ENCOUNTER — Inpatient Hospital Stay (HOSPITAL_COMMUNITY): Payer: Medicaid Other

## 2019-09-06 ENCOUNTER — Encounter (HOSPITAL_COMMUNITY): Payer: Self-pay | Admitting: Emergency Medicine

## 2019-09-06 ENCOUNTER — Inpatient Hospital Stay (HOSPITAL_COMMUNITY)
Admission: AD | Admit: 2019-09-06 | Discharge: 2019-09-06 | Disposition: A | Discharge status: Home / Self Care | Payer: Medicaid Other | Attending: Obstetrics & Gynecology | Admitting: Obstetrics & Gynecology

## 2019-09-06 DIAGNOSIS — R1031 Right lower quadrant pain: Secondary | ICD-10-CM | POA: Diagnosis not present

## 2019-09-06 DIAGNOSIS — O99891 Other specified diseases and conditions complicating pregnancy: Secondary | ICD-10-CM | POA: Diagnosis not present

## 2019-09-06 DIAGNOSIS — F17210 Nicotine dependence, cigarettes, uncomplicated: Secondary | ICD-10-CM | POA: Insufficient documentation

## 2019-09-06 DIAGNOSIS — O99331 Smoking (tobacco) complicating pregnancy, first trimester: Secondary | ICD-10-CM | POA: Diagnosis not present

## 2019-09-06 DIAGNOSIS — Z3A01 Less than 8 weeks gestation of pregnancy: Secondary | ICD-10-CM | POA: Diagnosis not present

## 2019-09-06 DIAGNOSIS — O3481 Maternal care for other abnormalities of pelvic organs, first trimester: Secondary | ICD-10-CM | POA: Diagnosis not present

## 2019-09-06 DIAGNOSIS — Z3A09 9 weeks gestation of pregnancy: Secondary | ICD-10-CM

## 2019-09-06 DIAGNOSIS — O34219 Maternal care for unspecified type scar from previous cesarean delivery: Secondary | ICD-10-CM | POA: Insufficient documentation

## 2019-09-06 DIAGNOSIS — O26899 Other specified pregnancy related conditions, unspecified trimester: Secondary | ICD-10-CM

## 2019-09-06 DIAGNOSIS — R102 Pelvic and perineal pain: Secondary | ICD-10-CM | POA: Diagnosis not present

## 2019-09-06 DIAGNOSIS — O26891 Other specified pregnancy related conditions, first trimester: Secondary | ICD-10-CM | POA: Diagnosis not present

## 2019-09-06 DIAGNOSIS — N83201 Unspecified ovarian cyst, right side: Secondary | ICD-10-CM | POA: Insufficient documentation

## 2019-09-06 DIAGNOSIS — O3680X0 Pregnancy with inconclusive fetal viability, not applicable or unspecified: Secondary | ICD-10-CM

## 2019-09-06 DIAGNOSIS — R141 Gas pain: Secondary | ICD-10-CM

## 2019-09-06 LAB — CBC
HCT: 38.9 % (ref 36.0–46.0)
Hemoglobin: 12.5 g/dL (ref 12.0–15.0)
MCH: 27.9 pg (ref 26.0–34.0)
MCHC: 32.1 g/dL (ref 30.0–36.0)
MCV: 86.8 fL (ref 80.0–100.0)
Platelets: 367 10*3/uL (ref 150–400)
RBC: 4.48 MIL/uL (ref 3.87–5.11)
RDW: 11.8 % (ref 11.5–15.5)
WBC: 7.4 10*3/uL (ref 4.0–10.5)
nRBC: 0 % (ref 0.0–0.2)

## 2019-09-06 LAB — URINALYSIS, ROUTINE W REFLEX MICROSCOPIC
Bilirubin Urine: NEGATIVE
Glucose, UA: NEGATIVE mg/dL
Hgb urine dipstick: NEGATIVE
Ketones, ur: NEGATIVE mg/dL
Leukocytes,Ua: NEGATIVE
Nitrite: NEGATIVE
Protein, ur: NEGATIVE mg/dL
Specific Gravity, Urine: 1.017 (ref 1.005–1.030)
pH: 6 (ref 5.0–8.0)

## 2019-09-06 LAB — I-STAT BETA HCG BLOOD, ED (NOT ORDERABLE): I-stat hCG, quantitative: >2000 m[IU]/mL — ABNORMAL HIGH (ref <5)

## 2019-09-06 LAB — COMPREHENSIVE METABOLIC PANEL
ALT: 29 U/L (ref 0–44)
AST: 32 U/L (ref 15–41)
Albumin: 3.8 g/dL (ref 3.5–5.0)
Alkaline Phosphatase: 51 U/L (ref 38–126)
Anion gap: 9 (ref 5–15)
BUN: 12 mg/dL (ref 6–20)
CO2: 20 mmol/L — ABNORMAL LOW (ref 22–32)
Calcium: 9.3 mg/dL (ref 8.9–10.3)
Chloride: 102 mmol/L (ref 98–111)
Creatinine, Ser: 0.74 mg/dL (ref 0.44–1.00)
GFR calc Af Amer: >60 mL/min (ref >60)
GFR calc non Af Amer: >60 mL/min (ref >60)
Glucose, Bld: 119 mg/dL — ABNORMAL HIGH (ref 70–99)
Potassium: 3.6 mmol/L (ref 3.5–5.1)
Sodium: 131 mmol/L — ABNORMAL LOW (ref 135–145)
Total Bilirubin: <0.1 mg/dL — ABNORMAL LOW (ref 0.3–1.2)
Total Protein: 6.6 g/dL (ref 6.5–8.1)

## 2019-09-06 LAB — LIPASE, BLOOD: Lipase: 43 U/L (ref 11–51)

## 2019-09-06 MED ORDER — SIMETHICONE 80 MG PO CHEW: 80.0000 mg | CHEWABLE_TABLET | Freq: Four times a day (QID) | ORAL | 0 refills | Status: DC | PRN

## 2019-09-06 MED ORDER — SODIUM CHLORIDE 0.9% FLUSH: 3.0000 mL | Freq: Once | INTRAVENOUS | Status: DC

## 2019-09-06 NOTE — ED Provider Notes (Signed)
  MSE was initiated and I personally evaluated the patient and placed orders (if any) at  1:50 AM on September 06, 2019.   34 y.o. F G5P2 approx [redacted] weeks gestation here with right lower abdominal/groin pain.  Has not had confirmed IUP but + POC pregnancy on 09/01/19.  Denies bleeding, discharge, loss of fluids, N/V/D, or urinary symptoms.  Prior pregnancies with 1 stillborn, 2 miscarriages, 2 premature births.  Has not yet established with OB-GYN.  The patient appears stable so that the remainder of the MSE may be completed by another provider.  Transfer to MAU for further evaluation/management.   Larene Pickett, PA-C 09/06/19 Murphy, Delice Bison, DO 09/06/19 0246

## 2019-09-06 NOTE — MAU Provider Note (Signed)
Chief Complaint: Abdominal Pain ([redacted] weeks pregnant)   First Provider Initiated Contact with Patient 09/06/19 0226        SUBJECTIVE HPI: Meredith Jackson is a 34 y.o. [id number] at [redacted]w[redacted]d by LMP who presents to maternity admissions reporting pain in right lower abdomen for several hours.  Worried because her sister had an ectopic pregnancy.  Pt has a complicated OB history and is worried this will be a problem also. . She denies vaginal bleeding, vaginal itching/burning, urinary symptoms, h/a, dizziness, n/v, or fever/chills.    Abdominal Pain This is a new problem. The current episode started today. The onset quality is sudden. The problem occurs constantly. The problem has been unchanged. The pain is located in the RLQ. The pain is moderate. The quality of the pain is cramping and aching. The abdominal pain does not radiate. Pertinent negatives include no anorexia, constipation, diarrhea, dysuria, fever, myalgias, nausea or vomiting. The pain is aggravated by palpation. The pain is relieved by nothing. She has tried nothing for the symptoms. Her past medical history is significant for abdominal surgery (classical C/S).   RN Note: Having pain RLQ since 1900. Having trouble with constipation but does not feel like that. Had small BM Thurs.  Feels like something is pushing on RLQ. Has been arguing with boyfriend and unsure if that made pain worse. Denies vag bleeding or d/c   Past Medical History:  Diagnosis Date  . Anxiety   . Depression   . Encounter for Nexplanon removal 09/12/2017   Past Surgical History:  Procedure Laterality Date  . CESAREAN SECTION     x2   Social History   Socioeconomic History  . Marital status: Single    Spouse name: Not on file  . Number of children: Not on file  . Years of education: Not on file  . Highest education level: Not on file  Occupational History  . Not on file  Tobacco Use  . Smoking status: Current Every Day Smoker    Packs/day: 0.50    Types:  Cigarettes  . Smokeless tobacco: Never Used  Substance and Sexual Activity  . Alcohol use: Yes    Comment: Occasional  . Drug use: No  . Sexual activity: Not Currently    Partners: Male    Birth control/protection: Implant  Other Topics Concern  . Not on file  Social History Narrative  . Not on file   Social Determinants of Health   Financial Resource Strain:   . Difficulty of Paying Living Expenses: Not on file  Food Insecurity:   . Worried About Charity fundraiser in the Last Year: Not on file  . Ran Out of Food in the Last Year: Not on file  Transportation Needs:   . Lack of Transportation (Medical): Not on file  . Lack of Transportation (Non-Medical): Not on file  Physical Activity:   . Days of Exercise per Week: Not on file  . Minutes of Exercise per Session: Not on file  Stress:   . Feeling of Stress : Not on file  Social Connections:   . Frequency of Communication with Friends and Family: Not on file  . Frequency of Social Gatherings with Friends and Family: Not on file  . Attends Religious Services: Not on file  . Active Member of Clubs or Organizations: Not on file  . Attends Archivist Meetings: Not on file  . Marital Status: Not on file  Intimate Partner Violence:   . Fear of Current  or Ex-Partner: Not on file  . Emotionally Abused: Not on file  . Physically Abused: Not on file  . Sexually Abused: Not on file   No current facility-administered medications on file prior to encounter.   No current outpatient medications on file prior to encounter.   No Known Allergies  I have reviewed patient's Past Medical Hx, Surgical Hx, Family Hx, Social Hx, medications and allergies.   ROS:  Review of Systems  Constitutional: Negative for fever.  Gastrointestinal: Positive for abdominal pain. Negative for anorexia, constipation, diarrhea, nausea and vomiting.  Genitourinary: Negative for dysuria.  Musculoskeletal: Negative for myalgias.   Review of  Systems  Other systems negative   Physical Exam  Physical Exam Patient Vitals for the past 24 hrs:  BP Temp Temp src Pulse Resp SpO2 Height Weight  09/06/19 0210 130/66 -- -- 87 -- -- -- --  09/06/19 0209 -- 98.1 F (36.7 C) -- -- 18 -- 5\' 3"  (1.6 m) 80.7 kg  09/06/19 0140 118/71 97.6 F (36.4 C) Oral (!) 103 16 100 % -- --   Constitutional: Well-developed, well-nourished female in no acute distress.  Cardiovascular: normal rate Respiratory: normal effort GI: Abd soft, non-tender. Pos BS x 4 MS: Extremities nontender, no edema, normal ROM Neurologic: Alert and oriented x 4.  GU: Neg CVAT.  PELVIC EXAM: Cervix pink, visually closed, without lesion, scant white creamy discharge, vaginal walls and external genitalia normal Bimanual exam: Cervix 0/long/high, firm, anterior, neg CMT, uterus nontender, nonenlarged, adnexa without tenderness, enlargement, or mass on left, slightly tender on right  LAB RESULTS Results for orders placed or performed during the hospital encounter of 09/06/19 (from the past 24 hour(s))  Urinalysis, Routine w reflex microscopic     Status: Abnormal   Collection Time: 09/06/19  1:44 AM  Result Value Ref Range   Color, Urine YELLOW YELLOW   APPearance HAZY (A) CLEAR   Specific Gravity, Urine 1.017 1.005 - 1.030   pH 6.0 5.0 - 8.0   Glucose, UA NEGATIVE NEGATIVE mg/dL   Hgb urine dipstick NEGATIVE NEGATIVE   Bilirubin Urine NEGATIVE NEGATIVE   Ketones, ur NEGATIVE NEGATIVE mg/dL   Protein, ur NEGATIVE NEGATIVE mg/dL   Nitrite NEGATIVE NEGATIVE   Leukocytes,Ua NEGATIVE NEGATIVE  Lipase, blood     Status: None   Collection Time: 09/06/19  1:46 AM  Result Value Ref Range   Lipase 43 11 - 51 U/L  Comprehensive metabolic panel     Status: Abnormal   Collection Time: 09/06/19  1:46 AM  Result Value Ref Range   Sodium 131 (L) 135 - 145 mmol/L   Potassium 3.6 3.5 - 5.1 mmol/L   Chloride 102 98 - 111 mmol/L   CO2 20 (L) 22 - 32 mmol/L   Glucose, Bld 119  (H) 70 - 99 mg/dL   BUN 12 6 - 20 mg/dL   Creatinine, Ser 0.74 0.44 - 1.00 mg/dL   Calcium 9.3 8.9 - 10.3 mg/dL   Total Protein 6.6 6.5 - 8.1 g/dL   Albumin 3.8 3.5 - 5.0 g/dL   AST 32 15 - 41 U/L   ALT 29 0 - 44 U/L   Alkaline Phosphatase 51 38 - 126 U/L   Total Bilirubin <0.1 (L) 0.3 - 1.2 mg/dL   GFR calc non Af Amer >60 >60 mL/min   GFR calc Af Amer >60 >60 mL/min   Anion gap 9 5 - 15  CBC     Status: None   Collection Time:  09/06/19  1:46 AM  Result Value Ref Range   WBC 7.4 4.0 - 10.5 K/uL   RBC 4.48 3.87 - 5.11 MIL/uL   Hemoglobin 12.5 12.0 - 15.0 g/dL   HCT 38.9 36.0 - 46.0 %   MCV 86.8 80.0 - 100.0 fL   MCH 27.9 26.0 - 34.0 pg   MCHC 32.1 30.0 - 36.0 g/dL   RDW 11.8 11.5 - 15.5 %   Platelets 367 150 - 400 K/uL   nRBC 0.0 0.0 - 0.2 %  I-Stat beta hCG blood, ED     Status: Abnormal   Collection Time: 09/06/19  1:50 AM  Result Value Ref Range   I-stat hCG, quantitative >2,000.0 (H) <5 mIU/mL   Comment 3               IMAGING US OB Comp Less 14 Wks  Result Date: 09/06/2019 CLINICAL DATA:  Pregnant patient in in first-trimester pregnancy with pelvic pain. Pregnancy of unknown location. EXAM: OBSTETRIC <14 WK [place] AND TRANSVAGINAL OB US TECHNIQUE: Both transabdominal and transvaginal ultrasound examinations were performed for complete evaluation of the gestation as well as the maternal uterus, adnexal regions, and pelvic cul-de-sac. Transvaginal technique was performed to assess early pregnancy. COMPARISON:  None this pregnancy FINDINGS: Intrauterine gestational sac: Single Yolk sac:  Visualized. Embryo:  Not Visualized. Cardiac Activity: Not Visualized. MSD: 8.25 mm   5 w   5 d Subchorionic hemorrhage:  None visualized. Maternal uterus/adnexae: Anteverted uterus with intrauterine gestational sac and yolk sac. No fetal pole. No subchorionic hemorrhage. There are 2 simple cyst in the right ovary measuring 1.6 and 2.6 cm respectively. The 1.6 cm cyst may represent a corpus  luteum. Ovarian blood flow is seen. The left ovary appears normal. There is no adnexal mass or pelvic free fluid. IMPRESSION: 1. Early intrauterine gestational sac with a yolk sac, but no fetal pole or cardiac activity yet visualized. Recommend follow-up quantitative B-HCG levels and follow-up US in 10-14 days to assess viability. This recommendation follows SRU consensus guidelines: Diagnostic Criteria for Nonviable Pregnancy Early in the First Trimester. [place] Med 2013WM:705707. 2. No subchorionic hemorrhage. Electronically Signed   By: Keith Rake M.D.   On: 09/06/2019 03:11   US OB Transvaginal  Result Date: 09/06/2019 CLINICAL DATA:  Pregnant patient in in first-trimester pregnancy with pelvic pain. Pregnancy of unknown location. EXAM: OBSTETRIC <14 WK [place] AND TRANSVAGINAL OB US TECHNIQUE: Both transabdominal and transvaginal ultrasound examinations were performed for complete evaluation of the gestation as well as the maternal uterus, adnexal regions, and pelvic cul-de-sac. Transvaginal technique was performed to assess early pregnancy. COMPARISON:  None this pregnancy FINDINGS: Intrauterine gestational sac: Single Yolk sac:  Visualized. Embryo:  Not Visualized. Cardiac Activity: Not Visualized. MSD: 8.25 mm   5 w   5 d Subchorionic hemorrhage:  None visualized. Maternal uterus/adnexae: Anteverted uterus with intrauterine gestational sac and yolk sac. No fetal pole. No subchorionic hemorrhage. There are 2 simple cyst in the right ovary measuring 1.6 and 2.6 cm respectively. The 1.6 cm cyst may represent a corpus luteum. Ovarian blood flow is seen. The left ovary appears normal. There is no adnexal mass or pelvic free fluid. IMPRESSION: 1. Early intrauterine gestational sac with a yolk sac, but no fetal pole or cardiac activity yet visualized. Recommend follow-up quantitative B-HCG levels and follow-up US in 10-14 days to assess viability. This recommendation follows SRU consensus guidelines:  Diagnostic Criteria for Nonviable Pregnancy Early in the First Trimester. N  [employer]rief Med 2013; 369:1443-51. 2. No subchorionic hemorrhage. Electronically Signed   By: Keith Rake M.D.   On: 09/06/2019 03:11    MAU Management/MDM: Ordered usual first trimester r/o ectopic labs.   Pelvic exam and cultures done Will check baseline Ultrasound to rule out ectopic.   This bleeding/pain can represent a normal pregnancy with bleeding, spontaneous abortion or even an ectopic which can be life-threatening.  The process as listed above helps to determine which of these is present.  Reviewed results Two small R ovarian cysts are probably what are causing the pain Presence of yolk sac effectively rules out ectopic Recommend start prenatal care Repeat US 10 days   ASSESSMENT Pregnancy at [redacted]w[redacted]d by LMP, [redacted]w[redacted]d  by [place] RLQ pain Right ovarian cyst x2 Single IUGS with yolk sac  PLAN Discharge home Plan to repeat Ultrasound in about 7-10 days   Pt stable at time of discharge. Encouraged to return here or to other Urgent Care/ED if she develops worsening of symptoms, increase in pain, fever, or other concerning symptoms.    Hansel Feinstein CNM, MSN Certified Nurse-Midwife 09/06/2019  2:26 AM

## 2019-09-06 NOTE — ED Triage Notes (Addendum)
Patient reports RLQ pain onset this evening , denies emesis or diarrhea , no fever or chills, she is [redacted] weeks pregnant G4P2, denies vaginal bleeding or contractions.

## 2019-09-06 NOTE — Discharge Instructions (Signed)
First Trimester of Pregnancy  The first trimester of pregnancy is from week 1 until the end of week 13 (months 1 through 3). During this time, your baby will begin to develop inside you. At 6-8 weeks, the eyes and face are formed, and the heartbeat can be seen on ultrasound. At the end of 12 weeks, all the baby's organs are formed. Prenatal care is all the medical care you receive before the birth of your baby. Make sure you get good prenatal care and follow all of your doctor's instructions. Follow these instructions at home: Medicines  Take over-the-counter and prescription medicines only as told by your doctor. Some medicines are safe and some medicines are not safe during pregnancy.  Take a prenatal vitamin that contains at least 600 micrograms (mcg) of folic acid.  If you have trouble pooping (constipation), take medicine that will make your stool soft (stool softener) if your doctor approves. Eating and drinking   Eat regular, healthy meals.  Your doctor will tell you the amount of weight gain that is right for you.  Avoid raw meat and uncooked cheese.  If you feel sick to your stomach (nauseous) or throw up (vomit): ? Eat 4 or 5 small meals a day instead of 3 large meals. ? Try eating a few soda crackers. ? Drink liquids between meals instead of during meals.  To prevent constipation: ? Eat foods that are high in fiber, like fresh fruits and vegetables, whole grains, and beans. ? Drink enough fluids to keep your pee (urine) clear or pale yellow. Activity  Exercise only as told by your doctor. Stop exercising if you have cramps or pain in your lower belly (abdomen) or low back.  Do not exercise if it is too hot, too humid, or if you are in a place of great height (high altitude).  Try to avoid standing for long periods of time. Move your legs often if you must stand in one place for a long time.  Avoid heavy lifting.  Wear low-heeled shoes. Sit and stand up  straight.  You can have sex unless your doctor tells you not to. Relieving pain and discomfort  Wear a good support bra if your breasts are sore.  Take warm water baths (sitz baths) to soothe pain or discomfort caused by hemorrhoids. Use hemorrhoid cream if your doctor says it is okay.  Rest with your legs raised if you have leg cramps or low back pain.  If you have puffy, bulging veins (varicose veins) in your legs: ? Wear support hose or compression stockings as told by your doctor. ? Raise (elevate) your feet for 15 minutes, 3-4 times a day. ? Limit salt in your food. Prenatal care  Schedule your prenatal visits by the twelfth week of pregnancy.  Write down your questions. Take them to your prenatal visits.  Keep all your prenatal visits as told by your doctor. This is important. Safety  Wear your seat belt at all times when driving.  Make a list of emergency phone numbers. The list should include numbers for family, friends, the hospital, and police and fire departments. General instructions  Ask your doctor for a referral to a local prenatal class. Begin classes no later than at the start of month 6 of your pregnancy.  Ask for help if you need counseling or if you need help with nutrition. Your doctor can give you advice or tell you where to go for help.  Do not use hot tubs, steam   rooms, or saunas.  Do not douche or use tampons or scented sanitary pads.  Do not cross your legs for long periods of time.  Avoid all herbs and alcohol. Avoid drugs that are not approved by your doctor.  Do not use any tobacco products, including cigarettes, chewing tobacco, and electronic cigarettes. If you need help quitting, ask your doctor. You may get counseling or other support to help you quit.  Avoid cat litter boxes and soil used by cats. These carry germs that can cause birth defects in the baby and can cause a loss of your baby (miscarriage) or stillbirth.  Visit your dentist.  At home, brush your teeth with a soft toothbrush. Be gentle when you floss. Contact a doctor if:  You are dizzy.  You have mild cramps or pressure in your lower belly.  You have a nagging pain in your belly area.  You continue to feel sick to your stomach, you throw up, or you have watery poop (diarrhea).  You have a bad smelling fluid coming from your vagina.  You have pain when you pee (urinate).  You have increased puffiness (swelling) in your face, hands, legs, or ankles. Get help right away if:  You have a fever.  You are leaking fluid from your vagina.  You have spotting or bleeding from your vagina.  You have very bad belly cramping or pain.  You gain or lose weight rapidly.  You throw up blood. It may look like coffee grounds.  You are around people who have German measles, fifth disease, or chickenpox.  You have a very bad headache.  You have shortness of breath.  You have any kind of trauma, such as from a fall or a car accident. Summary  The first trimester of pregnancy is from week 1 until the end of week 13 (months 1 through 3).  To take care of yourself and your unborn baby, you will need to eat healthy meals, take medicines only if your doctor tells you to do so, and do activities that are safe for you and your baby.  Keep all follow-up visits as told by your doctor. This is important as your doctor will have to ensure that your baby is healthy and growing well. This information is not intended to replace advice given to you by your health care provider. Make sure you discuss any questions you have with your health care provider. Document Revised: 11/22/2018 Document Reviewed: 08/09/2016 Elsevier Patient Education  2020 Elsevier Inc.  

## 2019-09-06 NOTE — MAU Note (Signed)
Having pain RLQ since 1900. Having trouble with constipation but does not feel like that. Had small BM Thurs.  Feels like something is pushing on RLQ. Has been arguing with boyfriend and unsure if that made pain worse. Denies vag bleeding or d/c

## 2019-09-16 ENCOUNTER — Encounter: Payer: Self-pay | Admitting: Family Medicine

## 2019-09-16 ENCOUNTER — Ambulatory Visit (HOSPITAL_COMMUNITY)
Admission: RE | Admit: 2019-09-16 | Discharge: 2019-09-16 | Disposition: A | Discharge status: Home / Self Care | Payer: Medicaid Other | Source: Ambulatory Visit | Attending: Advanced Practice Midwife | Admitting: Advanced Practice Midwife

## 2019-09-16 ENCOUNTER — Ambulatory Visit (INDEPENDENT_AMBULATORY_CARE_PROVIDER_SITE_OTHER): Payer: Medicaid Other | Admitting: General Practice

## 2019-09-16 ENCOUNTER — Other Ambulatory Visit: Payer: Self-pay

## 2019-09-16 DIAGNOSIS — Z712 Person consulting for explanation of examination or test findings: Secondary | ICD-10-CM

## 2019-09-16 DIAGNOSIS — N83201 Unspecified ovarian cyst, right side: Secondary | ICD-10-CM | POA: Insufficient documentation

## 2019-09-16 DIAGNOSIS — Z3A01 Less than 8 weeks gestation of pregnancy: Secondary | ICD-10-CM

## 2019-09-16 DIAGNOSIS — R102 Pelvic and perineal pain: Secondary | ICD-10-CM | POA: Insufficient documentation

## 2019-09-16 DIAGNOSIS — O26899 Other specified pregnancy related conditions, unspecified trimester: Secondary | ICD-10-CM | POA: Diagnosis not present

## 2019-09-16 DIAGNOSIS — O3481 Maternal care for other abnormalities of pelvic organs, first trimester: Secondary | ICD-10-CM | POA: Diagnosis not present

## 2019-09-16 MED ORDER — PRENATAL PLUS 27-1 MG PO TABS: 1.0000 | ORAL_TABLET | Freq: Every day | ORAL | 11 refills | Status: DC

## 2019-09-16 NOTE — Progress Notes (Signed)
Patient presents to office today for viability ultrasound results. Reviewed results with Dr Ilda Basset who finds single living IUP, patient should begin prenatal care.   Informed patient of results, reviewed dating, & provided pictures. Patient verbalized understanding and asked about ovarian cyst. Discussed cyst is still present but reassured patient cysts are quite common and will eventually resolve. Patient verbalized understanding & requests PNV Rx. Rx sent per protocol. Patient has new OB intake on Friday 2/5 with [place] office.   Koren Bound RN BSN 09/16/19

## 2019-09-16 NOTE — Progress Notes (Signed)
Patient seen and assessed by nursing staff during this encounter. I have reviewed the chart and agree with the documentation and plan.  Aletha Halim, MD 09/16/2019 3:46 PM

## 2019-09-20 ENCOUNTER — Ambulatory Visit: Payer: Medicaid Other

## 2019-09-20 DIAGNOSIS — O09899 Supervision of other high risk pregnancies, unspecified trimester: Secondary | ICD-10-CM

## 2019-09-20 MED ORDER — BLOOD PRESSURE KIT DEVI: 1.0000 | 0 refills | Status: DC | PRN

## 2019-09-20 NOTE — Progress Notes (Signed)
..  Virtual Visit via Telephone Note  I connected with Meredith Jackson on 09/20/19 at  9:00 AM EST by telephone and verified that I am speaking with the correct person using two identifiers.  Location: Patient: Meredith Jackson   I discussed the limitations, risks, security and privacy concerns of performing an evaluation and management service by telephone and the availability of in person appointments. I also discussed with the patient that there may be a patient responsible charge related to this service. The patient expressed understanding and agreed to proceed.   History of Present Illness: PRENATAL INTAKE SUMMARY  Meredith Jackson presents today New OB Nurse Interview.  OB History    Gravida  6   Para  3   Term  1   Preterm  2   AB  2   Living  2     SAB  2   TAB      Ectopic      Multiple      Live Births  2          I have reviewed the patient's medical, obstetrical, social, and family histories, medications, and available lab results.  SUBJECTIVE She has no unusual complaints and complains of occasional pelvic pressure.   Observations/Objective: Initial nurse interview for history/labs (New OB)  EDD: 05-03-20 GA: 42w5dGP: G6P2  GENERAL APPEARANCE: alert, well appearing  Assessment and Plan: Normal pregnancy Pt reports 3 previous high risk pregnancies, one that ended with stillbirth, and 2 preterm classical c sections. Blood pressure kit sent to [facility].  Follow Up Instructions:   I discussed the assessment and treatment plan with the patient. The patient was provided an opportunity to ask questions and all were answered. The patient agreed with the plan and demonstrated an understanding of the instructions.   The patient was advised to call back or seek an in-person evaluation if the symptoms worsen or if the condition fails to improve as anticipated.  I provided 15 minutes of non-face-to-face time during this encounter.   BHinton Lovely RN

## 2019-09-20 NOTE — Progress Notes (Signed)
Patient seen and assessed by nursing staff during this encounter. I have reviewed the chart and agree with the documentation and plan.  Kerry Hough, PA-C 09/20/2019 9:51 AM

## 2019-09-27 ENCOUNTER — Encounter: Payer: Medicaid Other | Admitting: Certified Nurse Midwife

## 2019-10-08 ENCOUNTER — Telehealth: Payer: Self-pay

## 2019-10-08 NOTE — Telephone Encounter (Signed)
Pt called and reports that she is concerned because she is "no longer having pregnancy symptoms". She reports that her breast tenderness has gone away and this concerns her. Pt denies any cramping or bleeding. I advised pt that this can be normal and she will have pregnancy symptoms come and go throughout the pregnancy. I advised pt that a scheduler will call her if we have an earlier New OB appt than her one scheduled on 3/3 for reassurance. Pt voices understanding.

## 2019-10-09 ENCOUNTER — Other Ambulatory Visit: Payer: Self-pay

## 2019-10-09 ENCOUNTER — Inpatient Hospital Stay (HOSPITAL_COMMUNITY)
Admission: EM | Admit: 2019-10-09 | Discharge: 2019-10-09 | Disposition: A | Discharge status: Other Acute Inpatient Hospital | Payer: Medicaid Other | Attending: Emergency Medicine | Admitting: Emergency Medicine

## 2019-10-09 ENCOUNTER — Encounter (HOSPITAL_COMMUNITY): Payer: Self-pay | Admitting: Obstetrics and Gynecology

## 2019-10-09 DIAGNOSIS — Z3490 Encounter for supervision of normal pregnancy, unspecified, unspecified trimester: Secondary | ICD-10-CM

## 2019-10-09 DIAGNOSIS — O26891 Other specified pregnancy related conditions, first trimester: Secondary | ICD-10-CM | POA: Diagnosis present

## 2019-10-09 DIAGNOSIS — Z711 Person with feared health complaint in whom no diagnosis is made: Secondary | ICD-10-CM

## 2019-10-09 DIAGNOSIS — Z3A10 10 weeks gestation of pregnancy: Secondary | ICD-10-CM | POA: Insufficient documentation

## 2019-10-09 NOTE — ED Provider Notes (Signed)
MSE was initiated and I personally evaluated the patient and placed orders (if any) at  9:06 AM on October 09, 2019.  Patient presenting with concern for pregnancy.  She states she is [redacted] weeks pregnant, had confirmed IUP on ultrasound.  A few days ago she stopped feeling "symptoms of pregnancy."  She states her breasts are no longer sore and she no longer feels that she is having stretching in her uterus.  She is concerned that her body is preparing to lose her pregnancy.  She reports multiple stillbirths, miscarriages, and premature births, as well as a previous D&C.  She denies vaginal bleeding, vaginal discharge, or abdominal pain.  She has no other medical problems, takes only prenatals daily.  PE: Gen: appears nontoxic Abd: soft nontender   Discussed patient with MAU APP who accepts patient in transfer.  The patient appears stable so that the remainder of the MSE may be completed by another provider.   Franchot Heidelberg, PA-C 10/09/19 [id number]    Davonna Belling, MD 10/09/19 [phone]

## 2019-10-09 NOTE — MAU Provider Note (Signed)
First Provider Initiated Contact with Patient 10/09/19 1002      Ms. Meredith Jackson is a 34 y.o. [phone] female at [redacted]w[redacted]d weeks gestation was sent to [facility]U from [facility] with complaints of "loss of pregnancy symptoms." She states that she no longer has breast tenderness, her "belly doesn't feel bloated and no more gas."   BP 107/69 (BP Location: Right Arm)   Pulse 83   Temp 98.5 F (36.9 C) (Oral)   Resp 20   Ht 5\' 3"  (1.6 m)   Wt 81.1 kg   LMP 06/30/2019   SpO2 100%   BMI 31.69 kg/m    Procedure: Patient informed that the ultrasound is considered a limited OB ultrasound and is not intended to be a complete ultrasound exam.  Patient also informed that the ultrasound is not being completed with the intent of assessing for fetal or placental anomalies or any pelvic abnormalities.  Explained that the purpose of today's ultrasound is to assess for viability.  Baby was found to be viable and active. Patient was reassured once she could see the baby moving around on the screen and hearing the heartbeat with the doppler feature; picture given. Patient acknowledges the purpose of the exam and the limitations of the study.    A 34 yo [id number] female at [redacted]w[redacted]d gestation Physically well but worried  Intrauterine pregnancy  Medical screening exam complete  P Discharge from MAU in stable condition Warning signs for worsening condition that would warrant emergency follow-up discussed Patient may return to MAU as needed for pregnancy related complaints Advised to keep scheduled appt with [facility] Patient verbalized an understanding of the plan of care and agrees.   Laury Deep, CNM 10/09/2019 10:25 AM

## 2019-10-09 NOTE — MAU Note (Signed)
Pt presents stating she's losing her pregnancy symptoms.  No longer having breast tenderness or feels insides getting bigger or skin stretching. Denies VB.  States she feels like something is wrong.

## 2019-10-09 NOTE — Discharge Instructions (Signed)
DO NOT USE GOOGLE AS A RESOURCE!!!

## 2019-10-16 ENCOUNTER — Encounter: Payer: Self-pay | Admitting: Obstetrics and Gynecology

## 2019-10-16 ENCOUNTER — Other Ambulatory Visit: Payer: Self-pay

## 2019-10-16 ENCOUNTER — Other Ambulatory Visit (HOSPITAL_COMMUNITY)
Admission: RE | Admit: 2019-10-16 | Discharge: 2019-10-16 | Disposition: A | Discharge status: Home / Self Care | Payer: Medicaid Other | Source: Ambulatory Visit | Attending: Certified Nurse Midwife | Admitting: Certified Nurse Midwife

## 2019-10-16 ENCOUNTER — Ambulatory Visit (INDEPENDENT_AMBULATORY_CARE_PROVIDER_SITE_OTHER): Payer: Medicaid Other | Admitting: Obstetrics and Gynecology

## 2019-10-16 DIAGNOSIS — Z3A11 11 weeks gestation of pregnancy: Secondary | ICD-10-CM

## 2019-10-16 DIAGNOSIS — O09899 Supervision of other high risk pregnancies, unspecified trimester: Secondary | ICD-10-CM

## 2019-10-16 DIAGNOSIS — Z98891 History of uterine scar from previous surgery: Secondary | ICD-10-CM

## 2019-10-16 DIAGNOSIS — O99210 Obesity complicating pregnancy, unspecified trimester: Secondary | ICD-10-CM | POA: Insufficient documentation

## 2019-10-16 DIAGNOSIS — Z3481 Encounter for supervision of other normal pregnancy, first trimester: Secondary | ICD-10-CM | POA: Diagnosis not present

## 2019-10-16 DIAGNOSIS — O09891 Supervision of other high risk pregnancies, first trimester: Secondary | ICD-10-CM

## 2019-10-16 DIAGNOSIS — O99211 Obesity complicating pregnancy, first trimester: Secondary | ICD-10-CM

## 2019-10-16 DIAGNOSIS — Z8759 Personal history of other complications of pregnancy, childbirth and the puerperium: Secondary | ICD-10-CM

## 2019-10-16 MED ORDER — ASPIRIN EC 81 MG PO TBEC: 81.0000 mg | DELAYED_RELEASE_TABLET | Freq: Every day | ORAL | 2 refills | Status: DC

## 2019-10-16 NOTE — Progress Notes (Signed)
Subjective:    Meredith Jackson is a [id number] [redacted]w[redacted]d being seen today for her first obstetrical visit.  Her obstetrical history is significant for maternal obesity, history of IUFD at 83 weeks and classical c-section at 26 weeks due to breech presentation and abnormal dopplers. Patient does intend to breast feed. Pregnancy history fully reviewed.  Patient reports no complaints.  Vitals:   10/16/19 1328  BP: 117/74  Pulse: 89  Weight: 182 lb 4.8 oz (82.7 kg)    HISTORY: OB History  Gravida Para Term Preterm AB Living  6 3 1 2 2 2   SAB TAB Ectopic Multiple Live Births  2       2    # Outcome Date GA Lbr Len/2nd Weight Sex Delivery Anes PTL Lv  6 Current           5 Preterm 03/09/09 [redacted]w[redacted]d   F CS-Classical   LIV     Birth Comments: 36wks due to hx classical c/s     Complications: History of cesarean section, classical  4 Preterm 08/22/06 [redacted]w[redacted]d  1 lb 4 oz (0.567 kg) F CS-Classical   LIV     Birth Comments: 26wks Severe IUGR, reversed end diastolic flow     Complications: IUGR (intrauterine growth restriction) affecting care of mother, Oligohydramnios  3 Term 01/09/00 [redacted]w[redacted]d   M Vag-Spont   FD     Birth Comments: Stillbirth  2 SAB           1 SAB            Past Medical History:  Diagnosis Date  . Anxiety   . Depression   . Encounter for Nexplanon removal 09/12/2017   Past Surgical History:  Procedure Laterality Date  . CESAREAN SECTION     x2   Family History  Problem Relation Age of Onset  . Hyperthyroidism Mother   . Diabetes Mother      Exam    Uterus:   12-weeks  Pelvic Exam:    Perineum: Normal Perineum   Vulva: normal   Vagina:  normal mucosa, normal discharge   pH:    Cervix: multiparous appearance and cervix is closed and long   Adnexa: no mass, fullness, tenderness   Bony Pelvis: gynecoid  System: Breast:  normal appearance, no masses or tenderness   Skin: normal coloration and turgor, no rashes    Neurologic: normal, no focal deficits   Extremities:  normal strength, tone, and muscle mass   HEENT extra ocular movement intact   Mouth/Teeth mucous membranes moist, pharynx normal without lesions and dental hygiene good   Neck supple and no masses   Cardiovascular: regular rate and rhythm   Respiratory:  appears well, vitals normal, no respiratory distress, acyanotic, normal RR, chest clear, no wheezing, crepitations, rhonchi, normal symmetric air entry   Abdomen: soft, non-tender; bowel sounds normal; no masses,  no organomegaly   Urinary:       Assessment:    Pregnancy: [id number] Patient Active Problem List   Diagnosis Date Noted  . History of classical cesarean section 10/16/2019  . History of IUFD 10/16/2019  . Supervision of other high risk pregnancy, antepartum 09/20/2019        Plan:     Initial labs drawn. Prenatal vitamins. Problem list reviewed and updated. Genetic Screening discussed : panorama ordered.  Ultrasound discussed; fetal survey: ordered.  Follow up in 4 weeks. Rx ASA provided to start next week 50% of 30 min visit spent on counseling and coordination  of care.     Ahmere Hemenway 10/16/2019

## 2019-10-16 NOTE — Addendum Note (Signed)
Addended by: Tristan Schroeder D on: 10/16/2019 02:17 PM   Modules accepted: Orders

## 2019-10-16 NOTE — Addendum Note (Signed)
Addended by: Mora Bellman on: 10/16/2019 02:08 PM   Modules accepted: Orders

## 2019-10-16 NOTE — Patient Instructions (Signed)
First Trimester of Pregnancy The first trimester of pregnancy is from week 1 until the end of week 13 (months 1 through 3). A week after a sperm fertilizes an egg, the egg will implant on the wall of the uterus. This embryo will begin to develop into a baby. Genes from you and your partner will form the baby. The female genes will determine whether the baby will be a boy or a girl. At 6-8 weeks, the eyes and face will be formed, and the heartbeat can be seen on ultrasound. At the end of 12 weeks, all the baby's organs will be formed. Now that you are pregnant, you will want to do everything you can to have a healthy baby. Two of the most important things are to get good prenatal care and to follow your health care provider's instructions. Prenatal care is all the medical care you receive before the baby's birth. This care will help prevent, find, and treat any problems during the pregnancy and childbirth. Body changes during your first trimester Your body goes through many changes during pregnancy. The changes vary from woman to woman.  You may gain or lose a couple of pounds at first.  You may feel sick to your stomach (nauseous) and you may throw up (vomit). If the vomiting is uncontrollable, call your health care provider.  You may tire easily.  You may develop headaches that can be relieved by medicines. All medicines should be approved by your health care provider.  You may urinate more often. Painful urination may mean you have a bladder infection.  You may develop heartburn as a result of your pregnancy.  You may develop constipation because certain hormones are causing the muscles that push stool through your intestines to slow down.  You may develop hemorrhoids or swollen veins (varicose veins).  Your breasts may begin to grow larger and become tender. Your nipples may stick out more, and the tissue that surrounds them (areola) may become darker.  Your gums may bleed and may be  sensitive to brushing and flossing.  Dark spots or blotches (chloasma, mask of pregnancy) may develop on your face. This will likely fade after the baby is born.  Your menstrual periods will stop.  You may have a loss of appetite.  You may develop cravings for certain kinds of food.  You may have changes in your emotions from day to day, such as being excited to be pregnant or being concerned that something may go wrong with the pregnancy and baby.  You may have more vivid and strange dreams.  You may have changes in your hair. These can include thickening of your hair, rapid growth, and changes in texture. Some women also have hair loss during or after pregnancy, or hair that feels dry or thin. Your hair will most likely return to normal after your baby is born. What to expect at prenatal visits During a routine prenatal visit:  You will be weighed to make sure you and the baby are growing normally.  Your blood pressure will be taken.  Your abdomen will be measured to track your baby's growth.  The fetal heartbeat will be listened to between weeks 10 and 14 of your pregnancy.  Test results from any previous visits will be discussed. Your health care provider may ask you:  How you are feeling.  If you are feeling the baby move.  If you have had any abnormal symptoms, such as leaking fluid, bleeding, severe headaches, or  abdominal cramping.  If you are using any tobacco products, including cigarettes, chewing tobacco, and electronic cigarettes.  If you have any questions. Other tests that may be performed during your first trimester include:  Blood tests to find your blood type and to check for the presence of any previous infections. The tests will also be used to check for low iron levels (anemia) and protein on red blood cells (Rh antibodies). Depending on your risk factors, or if you previously had diabetes during pregnancy, you may have tests to check for high blood sugar  that affects pregnant women (gestational diabetes).  Urine tests to check for infections, diabetes, or protein in the urine.  An ultrasound to confirm the proper growth and development of the baby.  Fetal screens for spinal cord problems (spina bifida) and Down syndrome.  HIV (human immunodeficiency virus) testing. Routine prenatal testing includes screening for HIV, unless you choose not to have this test.  You may need other tests to make sure you and the baby are doing well. Follow these instructions at home: Medicines  Follow your health care provider's instructions regarding medicine use. Specific medicines may be either safe or unsafe to take during pregnancy.  Take a prenatal vitamin that contains at least 600 micrograms (mcg) of folic acid.  If you develop constipation, try taking a stool softener if your health care provider approves. Eating and drinking   Eat a balanced diet that includes fresh fruits and vegetables, whole grains, good sources of protein such as meat, eggs, or tofu, and low-fat dairy. Your health care provider will help you determine the amount of weight gain that is right for you.  Avoid raw meat and uncooked cheese. These carry germs that can cause birth defects in the baby.  Eating four or five small meals rather than three large meals a day may help relieve nausea and vomiting. If you start to feel nauseous, eating a few soda crackers can be helpful. Drinking liquids between meals, instead of during meals, also seems to help ease nausea and vomiting.  Limit foods that are high in fat and processed sugars, such as fried and sweet foods.  To prevent constipation: ? Eat foods that are high in fiber, such as fresh fruits and vegetables, whole grains, and beans. ? Drink enough fluid to keep your urine clear or pale yellow. Activity  Exercise only as directed by your health care provider. Most women can continue their usual exercise routine during  pregnancy. Try to exercise for 30 minutes at least 5 days a week. Exercising will help you: ? Control your weight. ? Stay in shape. ? Be prepared for labor and delivery.  Experiencing pain or cramping in the lower abdomen or lower back is a good sign that you should stop exercising. Check with your health care provider before continuing with normal exercises.  Try to avoid standing for long periods of time. Move your legs often if you must stand in one place for a long time.  Avoid heavy lifting.  Wear low-heeled shoes and practice good posture.  You may continue to have sex unless your health care provider tells you not to. Relieving pain and discomfort  Wear a good support bra to relieve breast tenderness.  Take warm sitz baths to soothe any pain or discomfort caused by hemorrhoids. Use hemorrhoid cream if your health care provider approves.  Rest with your legs elevated if you have leg cramps or low back pain.  If you develop varicose veins   in your legs, wear support hose. Elevate your feet for 15 minutes, 3-4 times a day. Limit salt in your diet. Prenatal care  Schedule your prenatal visits by the twelfth week of pregnancy. They are usually scheduled monthly at first, then more often in the last 2 months before delivery.  Write down your questions. Take them to your prenatal visits.  Keep all your prenatal visits as told by your health care provider. This is important. Safety  Wear your seat belt at all times when driving.  Make a list of emergency phone numbers, including numbers for family, friends, the hospital, and police and fire departments. General instructions  Ask your health care provider for a referral to a local prenatal education class. Begin classes no later than the beginning of month 6 of your pregnancy.  Ask for help if you have counseling or nutritional needs during pregnancy. Your health care provider can offer advice or refer you to specialists for help  with various needs.  Do not use hot tubs, steam rooms, or saunas.  Do not douche or use tampons or scented sanitary pads.  Do not cross your legs for long periods of time.  Avoid cat litter boxes and soil used by cats. These carry germs that can cause birth defects in the baby and possibly loss of the fetus by miscarriage or stillbirth.  Avoid all smoking, herbs, alcohol, and medicines not prescribed by your health care provider. Chemicals in these products affect the formation and growth of the baby.  Do not use any products that contain nicotine or tobacco, such as cigarettes and e-cigarettes. If you need help quitting, ask your health care provider. You may receive counseling support and other resources to help you quit.  Schedule a dentist appointment. At home, brush your teeth with a soft toothbrush and be gentle when you floss. Contact a health care provider if:  You have dizziness.  You have mild pelvic cramps, pelvic pressure, or nagging pain in the abdominal area.  You have persistent nausea, vomiting, or diarrhea.  You have a bad smelling vaginal discharge.  You have pain when you urinate.  You notice increased swelling in your face, hands, legs, or ankles.  You are exposed to fifth disease or chickenpox.  You are exposed to [place] measles (rubella) and have never had it. Get help right away if:  You have a fever.  You are leaking fluid from your vagina.  You have spotting or bleeding from your vagina.  You have severe abdominal cramping or pain.  You have rapid weight gain or loss.  You vomit blood or material that looks like coffee grounds.  You develop a severe headache.  You have shortness of breath.  You have any kind of trauma, such as from a fall or a car accident. Summary  The first trimester of pregnancy is from week 1 until the end of week 13 (months 1 through 3).  Your body goes through many changes during pregnancy. The changes vary from  woman to woman.  You will have routine prenatal visits. During those visits, your health care provider will examine you, discuss any test results you may have, and talk with you about how you are feeling. This information is not intended to replace advice given to you by your health care provider. Make sure you discuss any questions you have with your health care provider. Document Revised: 07/14/2017 Document Reviewed: 07/13/2016 Elsevier Patient Education  [place] of  Pregnancy The second trimester is from week 14 through week 27 (months 4 through 6). The second trimester is often a time when you feel your best. Your body has adjusted to being pregnant, and you begin to feel better physically. Usually, morning sickness has lessened or quit completely, you may have more energy, and you may have an increase in appetite. The second trimester is also a time when the fetus is growing rapidly. At the end of the sixth month, the fetus is about 9 inches long and weighs about 1 pounds. You will likely begin to feel the baby move (quickening) between 16 and 20 weeks of pregnancy. Body changes during your second trimester Your body continues to go through many changes during your second trimester. The changes vary from woman to woman.  Your weight will continue to increase. You will notice your lower abdomen bulging out.  You may begin to get stretch marks on your hips, abdomen, and breasts.  You may develop headaches that can be relieved by medicines. The medicines should be approved by your health care provider.  You may urinate more often because the fetus is pressing on your bladder.  You may develop or continue to have heartburn as a result of your pregnancy.  You may develop constipation because certain hormones are causing the muscles that push waste through your intestines to slow down.  You may develop hemorrhoids or swollen, bulging veins (varicose  veins).  You may have back pain. This is caused by: ? Weight gain. ? Pregnancy hormones that are relaxing the joints in your pelvis. ? A shift in weight and the muscles that support your balance.  Your breasts will continue to grow and they will continue to become tender.  Your gums may bleed and may be sensitive to brushing and flossing.  Dark spots or blotches (chloasma, mask of pregnancy) may develop on your face. This will likely fade after the baby is born.  A dark line from your belly button to the pubic area (linea nigra) may appear. This will likely fade after the baby is born.  You may have changes in your hair. These can include thickening of your hair, rapid growth, and changes in texture. Some women also have hair loss during or after pregnancy, or hair that feels dry or thin. Your hair will most likely return to normal after your baby is born. What to expect at prenatal visits During a routine prenatal visit:  You will be weighed to make sure you and the fetus are growing normally.  Your blood pressure will be taken.  Your abdomen will be measured to track your baby's growth.  The fetal heartbeat will be listened to.  Any test results from the previous visit will be discussed. Your health care provider may ask you:  How you are feeling.  If you are feeling the baby move.  If you have had any abnormal symptoms, such as leaking fluid, bleeding, severe headaches, or abdominal cramping.  If you are using any tobacco products, including cigarettes, chewing tobacco, and electronic cigarettes.  If you have any questions. Other tests that may be performed during your second trimester include:  Blood tests that check for: ? Low iron levels (anemia). ? High blood sugar that affects pregnant women (gestational diabetes) between 81 and 28 weeks. ? Rh antibodies. This is to check for a protein on red blood cells (Rh factor).  Urine tests to check for infections,  diabetes, or protein in the urine.  An ultrasound to confirm the proper growth and development of the baby.  An amniocentesis to check for possible genetic problems.  Fetal screens for spina bifida and Down syndrome.  HIV (human immunodeficiency virus) testing. Routine prenatal testing includes screening for HIV, unless you choose not to have this test. Follow these instructions at home: Medicines  Follow your health care provider's instructions regarding medicine use. Specific medicines may be either safe or unsafe to take during pregnancy.  Take a prenatal vitamin that contains at least 600 micrograms (mcg) of folic acid.  If you develop constipation, try taking a stool softener if your health care provider approves. Eating and drinking   Eat a balanced diet that includes fresh fruits and vegetables, whole grains, good sources of protein such as meat, eggs, or tofu, and low-fat dairy. Your health care provider will help you determine the amount of weight gain that is right for you.  Avoid raw meat and uncooked cheese. These carry germs that can cause birth defects in the baby.  If you have low calcium intake from food, talk to your health care provider about whether you should take a daily calcium supplement.  Limit foods that are high in fat and processed sugars, such as fried and sweet foods.  To prevent constipation: ? Drink enough fluid to keep your urine clear or pale yellow. ? Eat foods that are high in fiber, such as fresh fruits and vegetables, whole grains, and beans. Activity  Exercise only as directed by your health care provider. Most women can continue their usual exercise routine during pregnancy. Try to exercise for 30 minutes at least 5 days a week. Stop exercising if you experience uterine contractions.  Avoid heavy lifting, wear low heel shoes, and practice good posture.  A sexual relationship may be continued unless your health care provider directs you  otherwise. Relieving pain and discomfort  Wear a good support bra to prevent discomfort from breast tenderness.  Take warm sitz baths to soothe any pain or discomfort caused by hemorrhoids. Use hemorrhoid cream if your health care provider approves.  Rest with your legs elevated if you have leg cramps or low back pain.  If you develop varicose veins, wear support hose. Elevate your feet for 15 minutes, 3-4 times a day. Limit salt in your diet. Prenatal Care  Write down your questions. Take them to your prenatal visits.  Keep all your prenatal visits as told by your health care provider. This is important. Safety  Wear your seat belt at all times when driving.  Make a list of emergency phone numbers, including numbers for family, friends, the hospital, and police and fire departments. General instructions  Ask your health care provider for a referral to a local prenatal education class. Begin classes no later than the beginning of month 6 of your pregnancy.  Ask for help if you have counseling or nutritional needs during pregnancy. Your health care provider can offer advice or refer you to specialists for help with various needs.  Do not use hot tubs, steam rooms, or saunas.  Do not douche or use tampons or scented sanitary pads.  Do not cross your legs for long periods of time.  Avoid cat litter boxes and soil used by cats. These carry germs that can cause birth defects in the baby and possibly loss of the fetus by miscarriage or stillbirth.  Avoid all smoking, herbs, alcohol, and unprescribed drugs. Chemicals in these products can affect the formation and growth of  the baby.  Do not use any products that contain nicotine or tobacco, such as cigarettes and e-cigarettes. If you need help quitting, ask your health care provider.  Visit your dentist if you have not gone yet during your pregnancy. Use a soft toothbrush to brush your teeth and be gentle when you floss. Contact a  health care provider if:  You have dizziness.  You have mild pelvic cramps, pelvic pressure, or nagging pain in the abdominal area.  You have persistent nausea, vomiting, or diarrhea.  You have a bad smelling vaginal discharge.  You have pain when you urinate. Get help right away if:  You have a fever.  You are leaking fluid from your vagina.  You have spotting or bleeding from your vagina.  You have severe abdominal cramping or pain.  You have rapid weight gain or weight loss.  You have shortness of breath with chest pain.  You notice sudden or extreme swelling of your face, hands, ankles, feet, or legs.  You have not felt your baby move in over an hour.  You have severe headaches that do not go away when you take medicine.  You have vision changes. Summary  The second trimester is from week 14 through week 27 (months 4 through 6). It is also a time when the fetus is growing rapidly.  Your body goes through many changes during pregnancy. The changes vary from woman to woman.  Avoid all smoking, herbs, alcohol, and unprescribed drugs. These chemicals affect the formation and growth your baby.  Do not use any tobacco products, such as cigarettes, chewing tobacco, and e-cigarettes. If you need help quitting, ask your health care provider.  Contact your health care provider if you have any questions. Keep all prenatal visits as told by your health care provider. This is important. This information is not intended to replace advice given to you by your health care provider. Make sure you discuss any questions you have with your health care provider. Document Revised: 11/23/2018 Document Reviewed: 09/06/2016 Elsevier Patient Education  2020 [employer].   Contraception Choices Contraception, also called birth control, refers to methods or devices that prevent pregnancy. Hormonal methods Contraceptive implant  A contraceptive implant is a thin, plastic tube that  contains a hormone. It is inserted into the upper part of the arm. It can remain in place for up to 3 years. Progestin-only injections Progestin-only injections are injections of progestin, a synthetic form of the hormone progesterone. They are given every 3 months by a health care provider. Birth control pills  Birth control pills are pills that contain hormones that prevent pregnancy. They must be taken once a day, preferably at the same time each day. Birth control patch  The birth control patch contains hormones that prevent pregnancy. It is placed on the skin and must be changed once a week for three weeks and removed on the fourth week. A prescription is needed to use this method of contraception. Vaginal ring  A vaginal ring contains hormones that prevent pregnancy. It is placed in the vagina for three weeks and removed on the fourth week. After that, the process is repeated with a new ring. A prescription is needed to use this method of contraception. Emergency contraceptive Emergency contraceptives prevent pregnancy after unprotected sex. They come in pill form and can be taken up to 5 days after sex. They work best the sooner they are taken after having sex. Most emergency contraceptives are available without a prescription. This  method should not be used as your only form of birth control. Barrier methods Female condom  A female condom is a thin sheath that is worn over the penis during sex. Condoms keep sperm from going inside a woman's body. They can be used with a spermicide to increase their effectiveness. They should be disposed after a single use. Female condom  A female condom is a soft, loose-fitting sheath that is put into the vagina before sex. The condom keeps sperm from going inside a woman's body. They should be disposed after a single use. Diaphragm  A diaphragm is a soft, dome-shaped barrier. It is inserted into the vagina before sex, along with a spermicide. The  diaphragm blocks sperm from entering the uterus, and the spermicide kills sperm. A diaphragm should be left in the vagina for 6-8 hours after sex and removed within 24 hours. A diaphragm is prescribed and fitted by a health care provider. A diaphragm should be replaced every 1-2 years, after giving birth, after gaining more than 15 lb (6.8 kg), and after pelvic surgery. Cervical cap  A cervical cap is a round, soft latex or plastic cup that fits over the cervix. It is inserted into the vagina before sex, along with spermicide. It blocks sperm from entering the uterus. The cap should be left in place for 6-8 hours after sex and removed within 48 hours. A cervical cap must be prescribed and fitted by a health care provider. It should be replaced every 2 years. Sponge  A sponge is a soft, circular piece of polyurethane foam with spermicide on it. The sponge helps block sperm from entering the uterus, and the spermicide kills sperm. To use it, you make it wet and then insert it into the vagina. It should be inserted before sex, left in for at least 6 hours after sex, and removed and thrown away within 30 hours. Spermicides Spermicides are chemicals that kill or block sperm from entering the cervix and uterus. They can come as a cream, jelly, suppository, foam, or tablet. A spermicide should be inserted into the vagina with an applicator at least 29-79 minutes before sex to allow time for it to work. The process must be repeated every time you have sex. Spermicides do not require a prescription. Intrauterine contraception Intrauterine device (IUD) An IUD is a T-shaped device that is put in a woman's uterus. There are two types:  Hormone IUD.This type contains progestin, a synthetic form of the hormone progesterone. This type can stay in place for 3-5 years.  Copper IUD.This type is wrapped in copper wire. It can stay in place for 10 years.  Permanent methods of contraception Female tubal ligation In  this method, a woman's fallopian tubes are sealed, tied, or blocked during surgery to prevent eggs from traveling to the uterus. Hysteroscopic sterilization In this method, a small, flexible insert is placed into each fallopian tube. The inserts cause scar tissue to form in the fallopian tubes and block them, so sperm cannot reach an egg. The procedure takes about 3 months to be effective. Another form of birth control must be used during those 3 months. Female sterilization This is a procedure to tie off the tubes that carry sperm (vasectomy). After the procedure, the man can still ejaculate fluid (semen). Natural planning methods Natural family planning In this method, a couple does not have sex on days when the woman could become pregnant. Calendar method This means keeping track of the length of each menstrual cycle,  identifying the days when pregnancy can happen, and not having sex on those days. Ovulation method In this method, a couple avoids sex during ovulation. Symptothermal method This method involves not having sex during ovulation. The woman typically checks for ovulation by watching changes in her temperature and in the consistency of cervical mucus. Post-ovulation method In this method, a couple waits to have sex until after ovulation. Summary  Contraception, also called birth control, means methods or devices that prevent pregnancy.  Hormonal methods of contraception include implants, injections, pills, patches, vaginal rings, and emergency contraceptives.  Barrier methods of contraception can include female condoms, female condoms, diaphragms, cervical caps, sponges, and spermicides.  There are two types of IUDs (intrauterine devices). An IUD can be put in a woman's uterus to prevent pregnancy for 3-5 years.  Permanent sterilization can be done through a procedure for males, females, or both.  Natural family planning methods involve not having sex on days when the woman could  become pregnant. This information is not intended to replace advice given to you by your health care provider. Make sure you discuss any questions you have with your health care provider. Document Revised: 08/03/2017 Document Reviewed: 09/03/2016 Elsevier Patient Education  2020 [employer].   Breastfeeding  Choosing to breastfeed is one of the best decisions you can make for yourself and your baby. A change in hormones during pregnancy causes your breasts to make breast milk in your milk-producing glands. Hormones prevent breast milk from being released before your baby is born. They also prompt milk flow after birth. Once breastfeeding has begun, thoughts of your baby, as well as his or her sucking or crying, can stimulate the release of milk from your milk-producing glands. Benefits of breastfeeding Research shows that breastfeeding offers many health benefits for infants and mothers. It also offers a cost-free and convenient way to feed your baby. For your baby  Your first milk (colostrum) helps your baby's digestive system to function better.  Special cells in your milk (antibodies) help your baby to fight off infections.  Breastfed babies are less likely to develop asthma, allergies, obesity, or type 2 diabetes. They are also at lower risk for sudden infant death syndrome (SIDS).  Nutrients in breast milk are better able to meet your baby's needs compared to infant formula.  Breast milk improves your baby's brain development. For you  Breastfeeding helps to create a very special bond between you and your baby.  Breastfeeding is convenient. Breast milk costs nothing and is always available at the correct temperature.  Breastfeeding helps to burn calories. It helps you to lose the weight that you gained during pregnancy.  Breastfeeding makes your uterus return faster to its size before pregnancy. It also slows bleeding (lochia) after you give birth.  Breastfeeding helps to lower  your risk of developing type 2 diabetes, osteoporosis, rheumatoid arthritis, cardiovascular disease, and breast, ovarian, uterine, and endometrial cancer later in life. Breastfeeding basics Starting breastfeeding  Find a comfortable place to sit or lie down, with your neck and back well-supported.  Place a pillow or a rolled-up blanket under your baby to bring him or her to the level of your breast (if you are seated). Nursing pillows are specially designed to help support your arms and your baby while you breastfeed.  Make sure that your baby's tummy (abdomen) is facing your abdomen.  Gently massage your breast. With your fingertips, massage from the outer edges of your breast inward toward the nipple. This encourages  milk flow. If your milk flows slowly, you may need to continue this action during the feeding.  Support your breast with 4 fingers underneath and your thumb above your nipple (make the letter "C" with your hand). Make sure your fingers are well away from your nipple and your baby's mouth.  Stroke your baby's lips gently with your finger or nipple.  When your baby's mouth is open wide enough, quickly bring your baby to your breast, placing your entire nipple and as much of the areola as possible into your baby's mouth. The areola is the colored area around your nipple. ? More areola should be visible above your baby's upper lip than below the lower lip. ? Your baby's lips should be opened and extended outward (flanged) to ensure an adequate, comfortable latch. ? Your baby's tongue should be between his or her lower gum and your breast.  Make sure that your baby's mouth is correctly positioned around your nipple (latched). Your baby's lips should create a seal on your breast and be turned out (everted).  It is common for your baby to suck about 2-3 minutes in order to start the flow of breast milk. Latching Teaching your baby how to latch onto your breast properly is very  important. An improper latch can cause nipple pain, decreased milk supply, and poor weight gain in your baby. Also, if your baby is not latched onto your nipple properly, he or she may swallow some air during feeding. This can make your baby fussy. Burping your baby when you switch breasts during the feeding can help to get rid of the air. However, teaching your baby to latch on properly is still the best way to prevent fussiness from swallowing air while breastfeeding. Signs that your baby has successfully latched onto your nipple  Silent tugging or silent sucking, without causing you pain. Infant's lips should be extended outward (flanged).  Swallowing heard between every 3-4 sucks once your milk has started to flow (after your let-down milk reflex occurs).  Muscle movement above and in front of his or her ears while sucking. Signs that your baby has not successfully latched onto your nipple  Sucking sounds or smacking sounds from your baby while breastfeeding.  Nipple pain. If you think your baby has not latched on correctly, slip your finger into the corner of your baby's mouth to break the suction and place it between your baby's gums. Attempt to start breastfeeding again. Signs of successful breastfeeding Signs from your baby  Your baby will gradually decrease the number of sucks or will completely stop sucking.  Your baby will fall asleep.  Your baby's body will relax.  Your baby will retain a small amount of milk in his or her mouth.  Your baby will let go of your breast by himself or herself. Signs from you  Breasts that have increased in firmness, weight, and size 1-3 hours after feeding.  Breasts that are softer immediately after breastfeeding.  Increased milk volume, as well as a change in milk consistency and color by the fifth day of breastfeeding.  Nipples that are not sore, cracked, or bleeding. Signs that your baby is getting enough milk  Wetting at least 1-2  diapers during the first 24 hours after birth.  Wetting at least 5-6 diapers every 24 hours for the first week after birth. The urine should be clear or pale yellow by the age of 5 days.  Wetting 6-8 diapers every 24 hours as your baby continues  to grow and develop.  At least 3 stools in a 24-hour period by the age of 5 days. The stool should be soft and yellow.  At least 3 stools in a 24-hour period by the age of 7 days. The stool should be seedy and yellow.  No loss of weight greater than 10% of birth weight during the first 3 days of life.  Average weight gain of 4-7 oz (113-198 g) per week after the age of 4 days.  Consistent daily weight gain by the age of 5 days, without weight loss after the age of 2 weeks. After a feeding, your baby may spit up a small amount of milk. This is normal. Breastfeeding frequency and duration Frequent feeding will help you make more milk and can prevent sore nipples and extremely full breasts (breast engorgement). Breastfeed when you feel the need to reduce the fullness of your breasts or when your baby shows signs of hunger. This is called "breastfeeding on demand." Signs that your baby is hungry include:  Increased alertness, activity, or restlessness.  Movement of the head from side to side.  Opening of the mouth when the corner of the mouth or cheek is stroked (rooting).  Increased sucking sounds, smacking lips, cooing, sighing, or squeaking.  Hand-to-mouth movements and sucking on fingers or hands.  Fussing or crying. Avoid introducing a pacifier to your baby in the first 4-6 weeks after your baby is born. After this time, you may choose to use a pacifier. Research has shown that pacifier use during the first year of a baby's life decreases the risk of sudden infant death syndrome (SIDS). Allow your baby to feed on each breast as long as he or she wants. When your baby unlatches or falls asleep while feeding from the first breast, offer the  second breast. Because newborns are often sleepy in the first few weeks of life, you may need to awaken your baby to get him or her to feed. Breastfeeding times will vary from baby to baby. However, the following rules can serve as a guide to help you make sure that your baby is properly fed:  Newborns (babies 14 weeks of age or younger) may breastfeed every 1-3 hours.  Newborns should not go without breastfeeding for longer than 3 hours during the day or 5 hours during the night.  You should breastfeed your baby a minimum of 8 times in a 24-hour period. Breast milk pumping     Pumping and storing breast milk allows you to make sure that your baby is exclusively fed your breast milk, even at times when you are unable to breastfeed. This is especially important if you go back to work while you are still breastfeeding, or if you are not able to be present during feedings. Your lactation consultant can help you find a method of pumping that works best for you and give you guidelines about how long it is safe to store breast milk. Caring for your breasts while you breastfeed Nipples can become dry, cracked, and sore while breastfeeding. The following recommendations can help keep your breasts moisturized and healthy:  Avoid using soap on your nipples.  Wear a supportive bra designed especially for nursing. Avoid wearing underwire-style bras or extremely tight bras (sports bras).  Air-dry your nipples for 3-4 minutes after each feeding.  Use only cotton bra pads to absorb leaked breast milk. Leaking of breast milk between feedings is normal.  Use lanolin on your nipples after breastfeeding. Lanolin helps  to maintain your skin's normal moisture barrier. Pure lanolin is not harmful (not toxic) to your baby. You may also hand express a few drops of breast milk and gently massage that milk into your nipples and allow the milk to air-dry. In the first few weeks after giving birth, some women experience  breast engorgement. Engorgement can make your breasts feel heavy, warm, and tender to the touch. Engorgement peaks within 3-5 days after you give birth. The following recommendations can help to ease engorgement:  Completely empty your breasts while breastfeeding or pumping. You may want to start by applying warm, moist heat (in the shower or with warm, water-soaked hand towels) just before feeding or pumping. This increases circulation and helps the milk flow. If your baby does not completely empty your breasts while breastfeeding, pump any extra milk after he or she is finished.  Apply ice packs to your breasts immediately after breastfeeding or pumping, unless this is too uncomfortable for you. To do this: ? Put ice in a plastic bag. ? Place a towel between your skin and the bag. ? Leave the ice on for 20 minutes, 2-3 times a day.  Make sure that your baby is latched on and positioned properly while breastfeeding. If engorgement persists after 48 hours of following these recommendations, contact your health care provider or a Science writer. Overall health care recommendations while breastfeeding  Eat 3 healthy meals and 3 snacks every day. Well-nourished mothers who are breastfeeding need an additional 450-500 calories a day. You can meet this requirement by increasing the amount of a balanced diet that you eat.  Drink enough water to keep your urine pale yellow or clear.  Rest often, relax, and continue to take your prenatal vitamins to prevent fatigue, stress, and low vitamin and mineral levels in your body (nutrient deficiencies).  Do not use any products that contain nicotine or tobacco, such as cigarettes and e-cigarettes. Your baby may be harmed by chemicals from cigarettes that pass into breast milk and exposure to secondhand smoke. If you need help quitting, ask your health care provider.  Avoid alcohol.  Do not use illegal drugs or marijuana.  Talk with your health care  provider before taking any medicines. These include over-the-counter and prescription medicines as well as vitamins and herbal supplements. Some medicines that may be harmful to your baby can pass through breast milk.  It is possible to become pregnant while breastfeeding. If birth control is desired, ask your health care provider about options that will be safe while breastfeeding your baby. Where to find more information: [employer]: www.llli.org Contact a health care provider if:  You feel like you want to stop breastfeeding or have become frustrated with breastfeeding.  Your nipples are cracked or bleeding.  Your breasts are red, tender, or warm.  You have: ? Painful breasts or nipples. ? A swollen area on either breast. ? A fever or chills. ? Nausea or vomiting. ? Drainage other than breast milk from your nipples.  Your breasts do not become full before feedings by the fifth day after you give birth.  You feel sad and depressed.  Your baby is: ? Too sleepy to eat well. ? Having trouble sleeping. ? More than 35 week old and wetting fewer than 6 diapers in a 24-hour period. ? Not gaining weight by 52 days of age.  Your baby has fewer than 3 stools in a 24-hour period.  Your baby's skin or the white parts of  his or her eyes become yellow. Get help right away if:  Your baby is overly tired (lethargic) and does not want to wake up and feed.  Your baby develops an unexplained fever. Summary  Breastfeeding offers many health benefits for infant and mothers.  Try to breastfeed your infant when he or she shows early signs of hunger.  Gently tickle or stroke your baby's lips with your finger or nipple to allow the baby to open his or her mouth. Bring the baby to your breast. Make sure that much of the areola is in your baby's mouth. Offer one side and burp the baby before you offer the other side.  Talk with your health care provider or lactation consultant  if you have questions or you face problems as you breastfeed. This information is not intended to replace advice given to you by your health care provider. Make sure you discuss any questions you have with your health care provider. Document Revised: 10/26/2017 Document Reviewed: 09/02/2016 Elsevier Patient Education  2020 [employer]  

## 2019-10-16 NOTE — Progress Notes (Signed)
NOB in office, denies pain today, has BP cuff

## 2019-10-17 LAB — OBSTETRIC PANEL, INCLUDING HIV
Antibody Screen: NEGATIVE
Basophils Absolute: 0 10*3/uL (ref 0.0–0.2)
Basos: 0 %
EOS (ABSOLUTE): 0.2 10*3/uL (ref 0.0–0.4)
Eos: 3 %
HIV Screen 4th Generation wRfx: NONREACTIVE
Hematocrit: 34.6 % (ref 34.0–46.6)
Hemoglobin: 11.2 g/dL (ref 11.1–15.9)
Hepatitis B Surface Ag: NEGATIVE
Immature Grans (Abs): 0 10*3/uL (ref 0.0–0.1)
Immature Granulocytes: 0 %
Lymphocytes Absolute: 1.5 10*3/uL (ref 0.7–3.1)
Lymphs: 24 %
MCH: 27.7 pg (ref 26.6–33.0)
MCHC: 32.4 g/dL (ref 31.5–35.7)
MCV: 85 fL (ref 79–97)
Monocytes Absolute: 0.6 10*3/uL (ref 0.1–0.9)
Monocytes: 10 %
Neutrophils Absolute: 3.9 10*3/uL (ref 1.4–7.0)
Neutrophils: 63 %
Platelets: 328 10*3/uL (ref 150–450)
RBC: 4.05 x10E6/uL (ref 3.77–5.28)
RDW: 11.7 % (ref 11.7–15.4)
RPR Ser Ql: NONREACTIVE
Rh Factor: POSITIVE
Rubella Antibodies, IGG: 1.73 index (ref >0.99)
WBC: 6.2 10*3/uL (ref 3.4–10.8)

## 2019-10-17 LAB — CERVICOVAGINAL ANCILLARY ONLY
Bacterial Vaginitis (gardnerella): NEGATIVE
Candida Glabrata: NEGATIVE
Candida Vaginitis: NEGATIVE
Chlamydia: NEGATIVE
Comment: NEGATIVE
Comment: NEGATIVE
Comment: NEGATIVE
Comment: NEGATIVE
Comment: NEGATIVE
Comment: NORMAL
Neisseria Gonorrhea: NEGATIVE
Trichomonas: NEGATIVE

## 2019-10-17 LAB — HEMOGLOBIN A1C
Est. average glucose Bld gHb Est-mCnc: 126 mg/dL
Hgb A1c MFr Bld: 6 % — ABNORMAL HIGH (ref 4.8–5.6)

## 2019-10-17 LAB — HEPATITIS C ANTIBODY: Hep C Virus Ab: <0.1 s/co ratio (ref 0.0–0.9)

## 2019-10-18 ENCOUNTER — Other Ambulatory Visit: Payer: Self-pay | Admitting: Obstetrics and Gynecology

## 2019-10-18 LAB — URINE CULTURE, OB REFLEX

## 2019-10-18 LAB — CULTURE, OB URINE

## 2019-10-18 MED ORDER — CYCLOBENZAPRINE HCL 10 MG PO TABS: 10.0000 mg | ORAL_TABLET | Freq: Three times a day (TID) | ORAL | 2 refills | Status: DC | PRN

## 2019-10-24 ENCOUNTER — Other Ambulatory Visit: Payer: Medicaid Other

## 2019-10-24 ENCOUNTER — Other Ambulatory Visit: Payer: Self-pay

## 2019-10-24 DIAGNOSIS — O09899 Supervision of other high risk pregnancies, unspecified trimester: Secondary | ICD-10-CM

## 2019-10-25 ENCOUNTER — Encounter: Payer: Self-pay | Admitting: Obstetrics and Gynecology

## 2019-10-25 LAB — GLUCOSE TOLERANCE, 2 HOURS W/ 1HR
Glucose, 1 hour: 160 mg/dL (ref 65–179)
Glucose, 2 hour: 70 mg/dL (ref 65–152)
Glucose, Fasting: 85 mg/dL (ref 65–91)

## 2019-10-29 ENCOUNTER — Encounter: Payer: Self-pay | Admitting: Obstetrics and Gynecology

## 2019-10-31 ENCOUNTER — Encounter: Payer: Self-pay | Admitting: Obstetrics and Gynecology

## 2019-10-31 ENCOUNTER — Other Ambulatory Visit: Payer: Self-pay | Admitting: Obstetrics and Gynecology

## 2019-10-31 DIAGNOSIS — E71311 Medium chain acyl CoA dehydrogenase deficiency: Secondary | ICD-10-CM

## 2019-10-31 DIAGNOSIS — D563 Thalassemia minor: Secondary | ICD-10-CM

## 2019-11-13 ENCOUNTER — Other Ambulatory Visit: Payer: Self-pay

## 2019-11-13 ENCOUNTER — Ambulatory Visit (INDEPENDENT_AMBULATORY_CARE_PROVIDER_SITE_OTHER): Payer: Medicaid Other | Admitting: Obstetrics & Gynecology

## 2019-11-13 VITALS — BP 113/73 | HR 77 | Wt 179.0 lb

## 2019-11-13 DIAGNOSIS — O09292 Supervision of pregnancy with other poor reproductive or obstetric history, second trimester: Secondary | ICD-10-CM

## 2019-11-13 DIAGNOSIS — O09899 Supervision of other high risk pregnancies, unspecified trimester: Secondary | ICD-10-CM | POA: Diagnosis not present

## 2019-11-13 DIAGNOSIS — Z3A15 15 weeks gestation of pregnancy: Secondary | ICD-10-CM

## 2019-11-13 DIAGNOSIS — Z8759 Personal history of other complications of pregnancy, childbirth and the puerperium: Secondary | ICD-10-CM | POA: Diagnosis not present

## 2019-11-13 NOTE — Patient Instructions (Signed)

## 2019-11-13 NOTE — Progress Notes (Signed)
   PRENATAL VISIT NOTE  Subjective:  Meredith Jackson is a 34 y.o. [id number] at [redacted]w[redacted]d being seen today for ongoing prenatal care.  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  Patient reports vaginal irritation and vulvar lesion for one day.  Contractions: Not present. Vag. Bleeding: None.   . Denies leaking of fluid.   The following portions of the patient's history were reviewed and updated as appropriate: allergies, current medications, past family history, past medical history, past social history, past surgical history and problem list.   Objective:   Vitals:   11/13/19 1013  BP: 113/73  Pulse: 77  Weight: 179 lb (81.2 kg)    Fetal Status: Fetal Heart Rate (bpm): 150         General:  Alert, oriented and cooperative. Patient is in no acute distress.  Skin: Skin is warm and dry. No rash noted.   Cardiovascular: Normal heart rate noted  Respiratory: Normal respiratory effort, no problems with respiration noted  Abdomen: Soft, gravid, appropriate for gestational age.  Pain/Pressure: Absent     Pelvic: Cervical exam deferred        Extremities: Normal range of motion.     Mental Status: Normal mood and affect. Normal behavior. Normal judgment and thought content.   Assessment and Plan:  Pregnancy: [id number] at [redacted]w[redacted]d 1. Supervision of other high risk pregnancy, antepartum Genetic screen - AFP, Serum, Open Spina Bifida - Herpes simplex virus culture [facility] should call for genetic counseling 2. History of IUFD MFM f/u  Genital lesion r/o HSV, probe obtained from excoriated lesion posterior left vulva - Herpes simplex virus culture  Preterm labor symptoms and  general obstetric precautions including but not limited to vaginal bleeding, contractions, leaking of fluid and fetal movement were reviewed in detail with the patient. Please refer to After Visit Summary for other counseling recommendations.   Return in about 4 weeks (around 12/11/2019) for virtual.  Future Appointments  Date Time Provider [place]  12/09/2019 10:30 AM [place] [facility]  12/09/2019 10:30 AM [facility] [place] [facility]  12/09/2019  1:00 PM [place] [facility]ET[facility]SEL[facility] [place] [facility]  12/09/2019  1:30 PM [facility]    Emeterio Reeve, MD

## 2019-11-13 NOTE — Progress Notes (Signed)
Pt would like to discuss lab/genetic results. Pt also complains of yeast/vaginal irritation.

## 2019-11-14 ENCOUNTER — Encounter: Payer: Self-pay | Admitting: Obstetrics and Gynecology

## 2019-11-15 LAB — AFP, SERUM, OPEN SPINA BIFIDA
AFP MoM: 1.74
AFP Value: 53.5 ng/mL
Gest. Age on Collection Date: 15.3 weeks
Maternal Age At EDD: 34.2 yr
OSBR Risk 1 IN: 2954
Test Results:: NEGATIVE
Weight: 179 [lb_av]

## 2019-11-16 ENCOUNTER — Other Ambulatory Visit: Payer: Self-pay | Admitting: Obstetrics & Gynecology

## 2019-11-16 DIAGNOSIS — B009 Herpesviral infection, unspecified: Secondary | ICD-10-CM

## 2019-11-16 DIAGNOSIS — O98519 Other viral diseases complicating pregnancy, unspecified trimester: Secondary | ICD-10-CM | POA: Insufficient documentation

## 2019-11-16 LAB — HERPES SIMPLEX VIRUS CULTURE

## 2019-11-16 MED ORDER — VALACYCLOVIR HCL 1 G PO TABS: 1000.0000 mg | ORAL_TABLET | Freq: Every day | ORAL | 2 refills | Status: DC

## 2019-12-09 ENCOUNTER — Encounter (HOSPITAL_COMMUNITY): Payer: Self-pay

## 2019-12-09 ENCOUNTER — Ambulatory Visit (HOSPITAL_COMMUNITY)
Admission: RE | Admit: 2019-12-09 | Discharge: 2019-12-09 | Disposition: A | Discharge status: Home / Self Care | Payer: Medicaid Other | Source: Ambulatory Visit | Attending: Obstetrics and Gynecology | Admitting: Obstetrics and Gynecology

## 2019-12-09 ENCOUNTER — Ambulatory Visit (HOSPITAL_COMMUNITY): Payer: Medicaid Other | Admitting: *Deleted

## 2019-12-09 ENCOUNTER — Other Ambulatory Visit: Payer: Medicaid Other

## 2019-12-09 ENCOUNTER — Ambulatory Visit: Payer: Self-pay | Admitting: Genetic Counselor

## 2019-12-09 ENCOUNTER — Ambulatory Visit (HOSPITAL_BASED_OUTPATIENT_CLINIC_OR_DEPARTMENT_OTHER): Payer: Medicaid Other | Admitting: Genetic Counselor

## 2019-12-09 ENCOUNTER — Other Ambulatory Visit: Payer: Self-pay

## 2019-12-09 ENCOUNTER — Other Ambulatory Visit (HOSPITAL_COMMUNITY): Payer: Self-pay | Admitting: *Deleted

## 2019-12-09 DIAGNOSIS — O99212 Obesity complicating pregnancy, second trimester: Secondary | ICD-10-CM | POA: Diagnosis not present

## 2019-12-09 DIAGNOSIS — O09212 Supervision of pregnancy with history of pre-term labor, second trimester: Secondary | ICD-10-CM | POA: Diagnosis not present

## 2019-12-09 DIAGNOSIS — Z8759 Personal history of other complications of pregnancy, childbirth and the puerperium: Secondary | ICD-10-CM

## 2019-12-09 DIAGNOSIS — Z3A19 19 weeks gestation of pregnancy: Secondary | ICD-10-CM | POA: Diagnosis not present

## 2019-12-09 DIAGNOSIS — O3412 Maternal care for benign tumor of corpus uteri, second trimester: Secondary | ICD-10-CM | POA: Diagnosis not present

## 2019-12-09 DIAGNOSIS — O34219 Maternal care for unspecified type scar from previous cesarean delivery: Secondary | ICD-10-CM

## 2019-12-09 DIAGNOSIS — Z363 Encounter for antenatal screening for malformations: Secondary | ICD-10-CM | POA: Diagnosis not present

## 2019-12-09 DIAGNOSIS — Z315 Encounter for genetic counseling: Secondary | ICD-10-CM

## 2019-12-09 DIAGNOSIS — D259 Leiomyoma of uterus, unspecified: Secondary | ICD-10-CM | POA: Diagnosis not present

## 2019-12-09 DIAGNOSIS — D563 Thalassemia minor: Secondary | ICD-10-CM | POA: Diagnosis not present

## 2019-12-09 DIAGNOSIS — Z148 Genetic carrier of other disease: Secondary | ICD-10-CM

## 2019-12-09 DIAGNOSIS — O99210 Obesity complicating pregnancy, unspecified trimester: Secondary | ICD-10-CM | POA: Insufficient documentation

## 2019-12-09 DIAGNOSIS — O09292 Supervision of pregnancy with other poor reproductive or obstetric history, second trimester: Secondary | ICD-10-CM

## 2019-12-09 DIAGNOSIS — Z98891 History of uterine scar from previous surgery: Secondary | ICD-10-CM

## 2019-12-09 DIAGNOSIS — E669 Obesity, unspecified: Secondary | ICD-10-CM | POA: Diagnosis not present

## 2019-12-09 DIAGNOSIS — O09899 Supervision of other high risk pregnancies, unspecified trimester: Secondary | ICD-10-CM

## 2019-12-09 DIAGNOSIS — E71311 Medium chain acyl CoA dehydrogenase deficiency: Secondary | ICD-10-CM | POA: Diagnosis not present

## 2019-12-09 NOTE — Progress Notes (Signed)
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 Meredith Jackson came to her appointment alone due to COVID-19 visitor restrictions.   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 female. Her current pregnancy has completed [redacted]w[redacted]d (Estimated Date of Delivery: 05/03/20).  Meredith Jackson has had two previous miscarriages with two separate partners. She also had a stillborn son at 82 weeks' gestation with a different partner. The baby's cord was reportedly "wrapped around his neck". She also has two daughters, one of whom was born premature at 68 weeks' gestation due to severe intrauterine growth restriction and reversed end diastolic flow, and the other of whom was born premature at 63 weeks' gestation. Each of these children were delivered via Cesarean section. This is her first pregnancy with her current partner.  Meredith Jackson denied exposure to environmental toxins or chemical agents. She denied the use of alcohol or street drugs. She reported smoking cigarettes and has cut back significantly to ~3 cigarettes per day. She reported taking prenatal vitamins and aspirin. She denied significant viral illnesses, fevers, and bleeding during the course of her pregnancy. Meredith Jackson medical and surgical histories were otherwise noncontributory.   Prenatal exposure to tobacco can increase the risk for placenta previa and placental abruption, preterm birth, low birth weight, oral clefts, stillbirth, and sudden infant death syndrome (SIDS). Prenatal exposure to nicotine is also associated with an increased likelihood of neurological disorder and asthma. The risk of  pregnancy complications associated with cigarette exposure tends to increase with the amount of cigarettes a woman smokes. I praised Meredith Jackson for cutting back on the amount of cigarettes she smokes and encouraged her to continue reducing the number of cigarettes smoked per day with the goal to eventually quit altogether.  Family History  A three generation pedigree was drafted and reviewed. The family history is remarkable for the following:  - Meredith Jackson's daughter who was born at 3 weeks' gestation has had many complications as a result of her prematurity. These include eye surgeries, difficulties with reading necessitating an IEP, and motor and speech delays requiring physical and speech therapies. Meredith Jackson daughter who was born at 42 weeks' gestation also required physical therapy.  - Meredith Jackson's parents both have significant medical and developmental histories. Her mother has severe mental illness, seizures, thyroid problems, and diabetes. She is able to live on her own but receives services from the [facility]. She was able to graduate high school and college but has rapidly declined within the last few years. Meredith Jackson maternal uncle also has speech problems and lives in a group home. He was able to graduate high school. Meredith Jackson father was born with a "white eye" and cannot read, write, or drive. He has struggled with drug use. Meredith Jackson is the primary caretaker for her family members. None of these individuals have ever had a genetics evaluation to assess for the underlying etiology of their histories. Because of this, risk assessment is limited.  The remaining family histories were reviewed and found to be noncontributory for birth defects, intellectual disability, recurrent pregnancy loss, and known genetic conditions.    The patient's ethnicity is African American. The father of the pregnancy's ethnicity is African  American. Ashkenazi Jewish  ancestry and consanguinity were denied. Pedigree will be scanned under Media.  Discussion  Meredith Jackson had Horizon-14 carrier screening performed through [place]. The results of the screen identified her as a silent carrier for alpha-thalassemia (aa/a-). Alpha-thalassemia is different in its inheritance compared to other hemoglobinopathies as there are two copies of two alpha globin genes (HBA1 and HBA2) on each chromosome 16, or four alpha globin genes total (aa/aa). A person can be a carrier of one alpha gene mutation (aa/a-), also referred to as a "silent carrier". A person who carries two alpha globin gene mutations can either carry them in cis (both on the same chromosome, denoted as aa/--) or in trans (on different chromosomes, denoted as a-/a-). Alpha-thalassemia carriers of two mutations who have African American ancestry are more likely to have a trans arrangement (a-/a-); cis configuration is reported to be rare in individuals with African American ancestry.     There are several different forms of alpha-thalassemia. The most severe form of alpha-thalassemia, Hb Barts, is associated with an absence of alpha globin chain synthesis as a result of deletions of all four alpha globin genes (--/--).  Given that Meredith Jackson is a silent carrier (aa/a-), her pregnancies would not be at increased risk for Hb Barts, even if her partner is a carrier for alpha-thalassemia, as she will always pass on at least one copy of the alpha globin gene to her children. Hemoglobin H (HbH) disease is caused by three deleted or dysfunctioning alpha globin alleles (a-/--) and is characterized by microcytic hypochromic hemolytic anemia, hepatosplenomegaly, mild jaundice, growth retardation, and sometimes thalassemia-like bone changes. Given Meredith Jackson's silent carrier status (aa/a-), the current fetus would only be at risk for HbH disease (a-/--), if her partner is a carrier for two alpha globin mutations in cis (aa/--). If this is  the case, the chance of HbH disease in the pregnancy would be 1 in 4 (25%). However, if Meredith Jackson's partner is a carrier for two alpha globin mutations, he would be more likely to carry them in trans configuration (a-/a-) than the cis configuration (aa/--), given his ethnicity. If he is a carrier of alpha-thalassemia in trans, then the pregnancy would not be at increased risk for HbH disease. Based on the carrier frequency for alpha-thalassemia in the African American population, Ms. Castanon partner has a 1 in 30 chance of being any type of carrier for alpha-thalassemia.   Results from Ms. 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 MCAD deficiency is a condition that prevents the body from converting certain fats to energy, particularly during periods of fasting. MCAD deficiency is characterized by symptoms such as vomiting, lethargy, and hypoglycemia, most often during infancy or early childhood. Individuals affected by MCAD deficiency are at risk for serious complications including seizures, respiratory difficulties, liver problems, brain damage, coma, and sudden death. Symptoms of MCAD deficiency can be triggered by periods of fasting or illness. To avoid serious complications, infants with MCAD deficiency require frequent feedings with adequate calories from complex carbohydrates to maintain their blood sugar levels and avoid hypoglycemia.    MCAD deficiency is caused by mutations in the ACADM gene. This gene provides instructions for making an enzyme called medium-chain acyl-CoA dehydrogenase (MCAD). This enzyme is required to metabolize a group of fats found in foods and the body's fat tissues called medium-chain fatty acids. Pathogenic variants in the ACADM gene cause a deficiency of the MCAD enzyme, preventing  medium chain fatty acids from being metabolized properly. As a result, these fats are not converted to  energy, which can lead to the symptoms of this disorder. Additionally, medium-chain fatty acids may build up in tissues and damage the liber and brain. MCAD deficiency is inherited in an autosomal recessive pattern, meaning the current fetus only has a chance of being affected by MCAD deficiency if Ms. Starkel's partner is also a carrier for the condition. Based on his ethnicity, Ms. Winningham partner has a 1 in 172 chance of being a carrier for MCAD deficiency. Without carrier screening to refine risk and based on ethnicity alone, the couple currently has a 1 in 264 (0.4%) chance of having a child affected by MCAD deficiency. If Ms. Dea's partner is also a carrier, the chance of MCAD deficiency in the pregnancy would be 1 in 4 (25%). However, the condition is more common in people of [place] ancestry than in other ethnic groups.   Ms. Famularo carrier screening was negative for the other 12 conditions screened. Thus, her risk to be a carrier for these additional conditions (listed separately in the laboratory report) has been reduced but not eliminated. This also significantly reduces her risk of having a child affected by one of these conditions. We discussed that carrier testing for alpha-thalassemia and MCAD deficiency is recommended for Ms. Salomone's partner. Ms. Salatino indicated that her partner likely would not be interested in pursuing partner carrier screening.  We also reviewed that Ms. Lasecki had Pan[facility] NIPS through the laboratory [facility] that was low-risk for fetal aneuploidies. We reviewed that these results showed a less than 1 in 10,000 risk for trisomies 21, 18 and 13, and monosomy X (Turner syndrome). In addition, the risk for triploidy and sex chromosome trisomies (47,XXX and 47,XXY) was also low. Ms. Bartholomew elected to have cfDNA analysis for 22q11.2 deletion syndrome, which was also low risk (1 in 9000). We reviewed that while this testing identifies 94-99% of pregnancies with  trisomy 71, trisomy 34, trisomy 21, sex chromosome aneuploidies, and triploidy, it is NOT diagnostic. A positive test result requires confirmation by CVS or amniocentesis, and a negative test result does not rule out a fetal chromosome abnormality. She also understands that this testing does not identify all genetic conditions.  A complete ultrasound was performed today prior to our visit. The ultrasound report will be sent under separate cover. An echogenic intracardiac focus (EIF) was identified on ultrasound. This finding is considered to be a benign variant in the context of low risk NIPS results.There were no other visualized fetal anomalies or markers suggestive of aneuploidy. Ms. Kielty understands that screening tests, including ultrasound, cannot rule out all birth defects or genetic syndromes.  Ms. Veley was also counseled regarding diagnostic testing via amniocentesis. We discussed the technical aspects of the procedure and quoted up to a 1 in 500 (0.2%) risk for spontaneous pregnancy loss or other adverse pregnancy outcomes as a result of amniocentesis. Cultured cells from an amniocentesis sample allow for the visualization of a fetal karyotype, which can detect >99% of chromosomal aberrations. Chromosomal microarray can also be performed to identify smaller deletions or duplications of fetal chromosomal material. Amniocentesis could also be performed to assess whether the baby is affected by alpha-thalassemia or MCAD deficiency. After careful consideration, Ms. Guerino declined amniocentesis at this time. She understands that amniocentesis is available at any point after 16 weeks of pregnancy and that she may opt to undergo the procedure at a later date should  she change her mind.  Ms. Coppock declined partner carrier screening today, as her partner would likely not follow through with testing. I provided her with my contact information should partner carrier screening be desired at a later time. I  also provided her with information about alpha-thalassemia and MCAD deficiency from [facility] (formerly [facility]). I offered Ms. Umphlett information on the [facility] group given her history of a stillbirth, which she declined at this time.  I counseled Ms. Rud regarding the above risks and available options. The approximate face-to-face time with the genetic counselor was 40 minutes.  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 Patient indicated that her partner likely will not follow through with testing, but has my contact information if testing is ever desired  Reviewed low-risk NIPS result  Reduction in risk for Down syndrome, trisomy 60, trisomy 90, triploidy, sex chromosome aneuploidies, and 22q11.2 deletion syndrome  Reviewed results of ultrasound  Echogenic intracardiac focus identified. Considered to be benign variant in context of low-risk NIPS result  Offered additional testing and screening  Declined amniocentesis  Reviewed family history concerns   Buelah Manis, MS, Counselling psychologist

## 2019-12-11 ENCOUNTER — Encounter: Payer: Self-pay | Admitting: Obstetrics and Gynecology

## 2019-12-11 ENCOUNTER — Telehealth (INDEPENDENT_AMBULATORY_CARE_PROVIDER_SITE_OTHER): Payer: Medicaid Other | Admitting: Obstetrics and Gynecology

## 2019-12-11 VITALS — BP 112/64 | HR 98

## 2019-12-11 DIAGNOSIS — O358XX0 Maternal care for other (suspected) fetal abnormality and damage, not applicable or unspecified: Secondary | ICD-10-CM

## 2019-12-11 DIAGNOSIS — O35BXX0 Maternal care for other (suspected) fetal abnormality and damage, fetal cardiac anomalies, not applicable or unspecified: Secondary | ICD-10-CM | POA: Insufficient documentation

## 2019-12-11 DIAGNOSIS — Z8759 Personal history of other complications of pregnancy, childbirth and the puerperium: Secondary | ICD-10-CM

## 2019-12-11 DIAGNOSIS — O09899 Supervision of other high risk pregnancies, unspecified trimester: Secondary | ICD-10-CM

## 2019-12-11 DIAGNOSIS — E71311 Medium chain acyl CoA dehydrogenase deficiency: Secondary | ICD-10-CM

## 2019-12-11 DIAGNOSIS — Z98891 History of uterine scar from previous surgery: Secondary | ICD-10-CM

## 2019-12-11 DIAGNOSIS — D563 Thalassemia minor: Secondary | ICD-10-CM

## 2019-12-11 DIAGNOSIS — O98519 Other viral diseases complicating pregnancy, unspecified trimester: Secondary | ICD-10-CM

## 2019-12-11 DIAGNOSIS — B009 Herpesviral infection, unspecified: Secondary | ICD-10-CM

## 2019-12-11 DIAGNOSIS — O99210 Obesity complicating pregnancy, unspecified trimester: Secondary | ICD-10-CM

## 2019-12-11 LAB — AFP, SERUM, OPEN SPINA BIFIDA
AFP MoM: 1.27
AFP Value: 63 ng/mL
Gest. Age on Collection Date: 19 weeks
Maternal Age At EDD: 34.2 yr
OSBR Risk 1 IN: 10000
Test Results:: NEGATIVE
Weight: 182 [lb_av]

## 2019-12-11 NOTE — Progress Notes (Signed)
Pt is on the phone preparing for virtual visit with provider.  

## 2019-12-11 NOTE — Progress Notes (Signed)
OBSTETRICS PRENATAL VIRTUAL VISIT ENCOUNTER NOTE  Provider location: [facility] [place]   I connected with Arlester Marker on 12/11/19 at 10:15 AM EDT by MyChart Video Encounter at home and verified that I am speaking with the correct person using two identifiers.   I discussed the limitations, risks, security and privacy concerns of performing an evaluation and management service virtually and the availability of in person appointments. I also discussed with the patient that there may be a patient responsible charge related to this service. The patient expressed understanding and agreed to proceed. Subjective:  Meredith Jackson is a 34 y.o. [id number] at 34w3dbeing seen today for ongoing prenatal care.  She is currently monitored for the following issues for this high-risk pregnancy and has Supervision of other high risk pregnancy, antepartum; History of classical cesarean section; History of IUFD; Maternal obesity affecting pregnancy, antepartum; MCAD (medium-chain acyl-CoA dehydrogenase deficiency) ([place]; Alpha thalassemia silent carrier; Herpes infection during pregnancy, antepartum; and Fetal cardiac echogenic focus, antepartum on their problem list.  Patient reports no complaints.  Contractions: Not present. Vag. Bleeding: None.  Movement: Present. Denies any leaking of fluid.   The following portions of the patient's history were reviewed and updated as appropriate: allergies, current medications, past family history, past medical history, past social history, past surgical history and problem list.   Objective:   Vitals:   12/11/19 1017  BP: 112/64  Pulse: 98    Fetal Status:     Movement: Present     General:  Alert, oriented and cooperative. Patient is in no acute distress.  Respiratory: Normal respiratory effort, no problems with respiration noted  Mental Status: Normal mood and affect. Normal behavior. Normal judgment and thought content.  Rest of physical exam  deferred due to type of encounter  Imaging: [place]MFM OB DETAIL +14 WK  Result Date: 12/09/2019 ----------------------------------------------------------------------  OBSTETRICS REPORT                       (Signed Final 12/09/2019 01:41 pm) ---------------------------------------------------------------------- Patient Info  ID #:       [id number]                         D.O.B.:  [birth year](34 yrs)  Name:       Meredith Jackson                 Visit Date: 12/09/2019 10:48 am ---------------------------------------------------------------------- Performed By  Performed By:     HValda Favia         Ref. Address:     [address]  [address]                                                             [place]  Attending:        Tama High MD        [facility]  Referred By:      [place] ---------------------------------------------------------------------- Orders   #  Description                          Code         Ordered By   1  [place] MFM OB DETAIL +14 WK              76811.01     PEGGY CONSTANT  ----------------------------------------------------------------------   #  Order #                    Accession #                 Episode #   1  [id number]                  [id number]                  [id number]  ---------------------------------------------------------------------- Indications   [redacted] weeks gestation of pregnancy                Z3A.19   Encounter for antenatal screening for          Z36.3   malformations   Low risk NIPS, 7.5FF, AFP neg   Previous cesarean delivery, antepartum x2,     O34.219   classical   Genetic carrier (alpha thal and carrier        Z14.8   medium chain [employer]-coa)   Poor  obstetric history: Previous IUFD          O09.299   (stillbirth)   Poor obstetric history: Previous preterm       O09.219   delivery, antepartum (IUGR @ 26 weeks,   classical c/section repeat at 36 weeks)   Obesity complicating pregnancy, second         O99.212   trimester   Uterine fibroids affecting pregnancy in        O34.12, D25.9   second trimester, antepartum   Echogenic intracardiac focus of the heart      O35.8XX0   (EIF)  ---------------------------------------------------------------------- Fetal Evaluation  Num Of Fetuses:  1  Fetal Heart Rate(bpm):  157  Cardiac Activity:       Observed  Presentation:           Breech  Placenta:               Posterior  P. Cord Insertion:      Visualized, central  Amniotic Fluid  AFI FV:      Within normal limits                              Largest Pocket(cm)                              6.55 ---------------------------------------------------------------------- Biometry  BPD:        43  mm     G. Age:  19w 0d         45  %    CI:        74.11   %    70 - 86                                                          FL/HC:      20.2   %    16.1 - 18.3  HC:      158.6  mm     G. Age:  18w 5d         23  %    HC/AC:      1.03        1.09 - 1.39  AC:      153.4  mm     G. Age:  20w 4d         86  %    FL/BPD:     74.4   %  FL:         32  mm     G. Age:  20w 0d         73  %    FL/AC:      20.9   %    20 - 24  HUM:      31.6  mm     G. Age:  20w 4d         87  %  CER:      19.9  mm     G. Age:  19w 0d         46  %  NFT:       2.6  mm  CM:          4  mm  Est. FW:     330  gm    0 lb 12 oz      92  % ---------------------------------------------------------------------- OB History  Gravidity:    6         Term:   1        Prem:   2        SAB:   2  TOP:          0       Ectopic:  0        Living: 2 ---------------------------------------------------------------------- Gestational Age  LMP:  23w 1d        Date:  06/30/19                 EDD:   04/05/20  U/S  Today:     19w 4d                                        EDD:   04/30/20  Best:          19w 1d     Det. ByLoman Chroman         EDD:   05/03/20                                      (09/16/19) ---------------------------------------------------------------------- Anatomy  Cranium:               Appears normal         LVOT:                   Not well visualized  Cavum:                 Appears normal         Aortic Arch:            Not well visualized  Ventricles:            Appears normal         Ductal Arch:            Not well visualized  Choroid Plexus:        Appears normal         Diaphragm:              Appears normal  Cerebellum:            Appears normal         Stomach:                Appears normal, left                                                                        sided  Posterior Fossa:       Appears normal         Abdomen:                Appears normal  Nuchal Fold:           Appears normal         Abdominal Wall:         Not well visualized  Face:                  Profile nl; orbits not Cord Vessels:           Appears normal (3                         well visualized  vessel cord)  Lips:                  Not well visualized    Kidneys:                Appear normal  Palate:                Not well visualized    Bladder:                Appears normal  Thoracic:              Appears normal         Spine:                  Appears normal  Heart:                 Echogenic focus        Upper Extremities:      Appears normal                         in LV  RVOT:                  Not well visualized    Lower Extremities:      Appears normal  Other:  Rt heel visualized. Technically difficult due to maternal habitus and          fetal position. ---------------------------------------------------------------------- Cervix Uterus Adnexa  Cervix  Length:            3.7  cm.  Normal appearance by transabdominal scan.  Uterus  No abnormality visualized.  Left Ovary  No  adnexal mass visualized.  Right Ovary  No adnexal mass visualized.  Cul De Sac  No free fluid seen.  Adnexa  No abnormality visualized. ---------------------------------------------------------------------- Myomas   Site                     L(cm)      W(cm)      D(cm)      Location   Anterior                 2.7        1.5        2.4  ----------------------------------------------------------------------   Blood Flow                 RI        PI       Comments  ---------------------------------------------------------------------- Impression  G6 P2.  Patient is here for fetal anatomy scan.  Her EDD was  established by early ultrasound performed at 7 weeks.  Obstetric history is significant for 2 previous classical  cesarean deliveries the first pregnancy was complicated by  severe fetal growth restriction.  Cell free fetal DNA screening, the risks of fetal aneuploidies  are not increased.  MSAFP screening showed low risk for  open neural tube defects.  Patient is a silent carrier for alpha  thalassemia (aa/a-).  She is also a carrier for medium chain  acyl-C0A dehydrogenase deficiency.  Performed fetal anatomy scan.  An echogenic intracardiac  focus is seen.  No other markers of aneuploidies or fetal  structural defects are seen.  Fetal biometry is consistent with  her previously established dates.  Pelvic fluid is normal and  good fetal activity seen.  Placenta is posterior and there is no evidence of previa or  accreta.  A  small anterior intramural myoma seen  (measurements above).  Echogenic intracardiac focus: I counseled the patient that  echogenic focus is seen in about 3% to 4% of normal  fetuses, and in about 15%-20% of fetuses with Down  syndrome. She was reassured that echogenic focus is not  associated with any structural heart malformations.  I reassured her that given that she had low risk for Down  syndrome on cell free fetal DNA screening, this finding should  be considered a normal variant.  I also  informed her that only  amniocentesis karyotype.  I explained the procedure and  possible complications including miscarriage (1 in 500  procedures).  Patient opted not to have amniocentesis.  Patient met with our genetic counselor after ultrasound.  He  will be receiving a separate letter from our genetic counselor. ---------------------------------------------------------------------- Recommendations  -An appointment was made for her to return in 4 weeks for  completion of fetal anatomy.  -Fetal growth assessments every 4 weeks (history of fetal  growth restriction).  -Delivery at 37 weeks' gestation (previous classical cesarean  deliveries). ----------------------------------------------------------------------                  Tama High, MD Electronically Signed Final Report   12/09/2019 01:41 pm ----------------------------------------------------------------------   Assessment and Plan:  Pregnancy: P1G9842 at 56w3d1. Supervision of other high risk pregnancy, antepartum Stable  2. History of classical cesarean section For repeat c section at 37 weeks  3. History of IUFD U/S and antenatal testing as per MFM  4. Alpha thalassemia silent carrier S/P genetic counseling  5. Herpes infection during pregnancy, antepartum Sx have resolved from last visit. Will need prophylaxis @ 35 weeks  6. MCAD (medium-chain acyl-CoA dehydrogenase deficiency) ([place] Has seen genetic counselor  7. Maternal obesity affecting pregnancy, antepartum On BASA  8. Fetal cardiac echogenic focus, antepartum, single or unspecified fetus Followed by MFM and has seen genetic counselor  Preterm labor symptoms and general obstetric precautions including but not limited to vaginal bleeding, contractions, leaking of fluid and fetal movement were reviewed in detail with the patient. I discussed the assessment and treatment plan with the patient. The patient was provided an opportunity to ask questions and all were  answered. The patient agreed with the plan and demonstrated an understanding of the instructions. The patient was advised to call back or seek an in-person office evaluation/[facility]. Please refer to After Visit Summary for other counseling recommendations.   I provided 8 minutes of face-to-face time during this encounter.  Return in about 4 weeks (around 01/08/2020) for OB visit, virtual, MD provider.  Future Appointments  Date Time Provider DHar[place] 01/06/2020 12:45 PM WConverseNURSE WMesicMFC-US  01/06/2020 12:45 PM [place] [facility]

## 2020-01-04 DIAGNOSIS — Z03818 Encounter for observation for suspected exposure to other biological agents ruled out: Secondary | ICD-10-CM | POA: Diagnosis not present

## 2020-01-06 ENCOUNTER — Other Ambulatory Visit: Payer: Self-pay

## 2020-01-06 ENCOUNTER — Ambulatory Visit (HOSPITAL_COMMUNITY): Payer: Medicaid Other | Attending: Obstetrics and Gynecology

## 2020-01-06 ENCOUNTER — Other Ambulatory Visit: Payer: Self-pay | Admitting: *Deleted

## 2020-01-06 ENCOUNTER — Ambulatory Visit: Payer: Medicaid Other | Admitting: *Deleted

## 2020-01-06 DIAGNOSIS — Z98891 History of uterine scar from previous surgery: Secondary | ICD-10-CM | POA: Insufficient documentation

## 2020-01-06 DIAGNOSIS — Z8759 Personal history of other complications of pregnancy, childbirth and the puerperium: Secondary | ICD-10-CM | POA: Diagnosis not present

## 2020-01-06 DIAGNOSIS — D563 Thalassemia minor: Secondary | ICD-10-CM | POA: Insufficient documentation

## 2020-01-06 DIAGNOSIS — Z3A23 23 weeks gestation of pregnancy: Secondary | ICD-10-CM

## 2020-01-06 DIAGNOSIS — O99212 Obesity complicating pregnancy, second trimester: Secondary | ICD-10-CM

## 2020-01-06 DIAGNOSIS — O358XX0 Maternal care for other (suspected) fetal abnormality and damage, not applicable or unspecified: Secondary | ICD-10-CM

## 2020-01-06 DIAGNOSIS — Z362 Encounter for other antenatal screening follow-up: Secondary | ICD-10-CM | POA: Diagnosis not present

## 2020-01-06 DIAGNOSIS — Z148 Genetic carrier of other disease: Secondary | ICD-10-CM | POA: Diagnosis not present

## 2020-01-06 DIAGNOSIS — E669 Obesity, unspecified: Secondary | ICD-10-CM

## 2020-01-06 DIAGNOSIS — E71311 Medium chain acyl CoA dehydrogenase deficiency: Secondary | ICD-10-CM | POA: Diagnosis not present

## 2020-01-06 DIAGNOSIS — D259 Leiomyoma of uterus, unspecified: Secondary | ICD-10-CM | POA: Diagnosis not present

## 2020-01-06 DIAGNOSIS — O09292 Supervision of pregnancy with other poor reproductive or obstetric history, second trimester: Secondary | ICD-10-CM | POA: Diagnosis not present

## 2020-01-06 DIAGNOSIS — O34219 Maternal care for unspecified type scar from previous cesarean delivery: Secondary | ICD-10-CM | POA: Diagnosis not present

## 2020-01-06 DIAGNOSIS — O09212 Supervision of pregnancy with history of pre-term labor, second trimester: Secondary | ICD-10-CM

## 2020-01-06 DIAGNOSIS — O99210 Obesity complicating pregnancy, unspecified trimester: Secondary | ICD-10-CM | POA: Diagnosis not present

## 2020-01-06 DIAGNOSIS — O3412 Maternal care for benign tumor of corpus uteri, second trimester: Secondary | ICD-10-CM | POA: Diagnosis not present

## 2020-01-08 ENCOUNTER — Telehealth (INDEPENDENT_AMBULATORY_CARE_PROVIDER_SITE_OTHER): Payer: Medicaid Other | Admitting: Obstetrics & Gynecology

## 2020-01-08 DIAGNOSIS — O99212 Obesity complicating pregnancy, second trimester: Secondary | ICD-10-CM

## 2020-01-08 DIAGNOSIS — E6609 Other obesity due to excess calories: Secondary | ICD-10-CM

## 2020-01-08 DIAGNOSIS — Z98891 History of uterine scar from previous surgery: Secondary | ICD-10-CM

## 2020-01-08 DIAGNOSIS — O99012 Anemia complicating pregnancy, second trimester: Secondary | ICD-10-CM

## 2020-01-08 DIAGNOSIS — O99210 Obesity complicating pregnancy, unspecified trimester: Secondary | ICD-10-CM

## 2020-01-08 DIAGNOSIS — D563 Thalassemia minor: Secondary | ICD-10-CM

## 2020-01-08 DIAGNOSIS — Z3A23 23 weeks gestation of pregnancy: Secondary | ICD-10-CM

## 2020-01-08 DIAGNOSIS — Z8759 Personal history of other complications of pregnancy, childbirth and the puerperium: Secondary | ICD-10-CM

## 2020-01-08 DIAGNOSIS — E71311 Medium chain acyl CoA dehydrogenase deficiency: Secondary | ICD-10-CM

## 2020-01-08 NOTE — Progress Notes (Signed)
   TELEHEALTH OBSTETRICS VISIT ENCOUNTER NOTE  I connected with Meredith Jackson on 01/08/20 at 10:15 AM EDT by telephone at home and verified that I am speaking with the correct person using two identifiers.   I discussed the limitations, risks, security and privacy concerns of performing an evaluation and management service by telephone and the availability of in person appointments. I also discussed with the patient that there may be a patient responsible charge related to this service. The patient expressed understanding and agreed to proceed.  Subjective:  Meredith Jackson is a 34 y.o. [id number] at [redacted]w[redacted]d being followed for ongoing prenatal care.  She is currently monitored for the following issues for this high-risk pregnancy and has Supervision of other high risk pregnancy, antepartum; History of classical cesarean section; History of IUFD; Maternal obesity affecting pregnancy, antepartum; MCAD (medium-chain acyl-CoA dehydrogenase deficiency) ([place]); Alpha thalassemia silent carrier; Herpes infection during pregnancy, antepartum; and Fetal cardiac echogenic focus, antepartum on their problem list.  Patient reports ear pain. Reports fetal movement. Denies any contractions, bleeding or leaking of fluid.   The following portions of the patient's history were reviewed and updated as appropriate: allergies, current medications, past family history, past medical history, past social history, past surgical history and problem list.   Objective:   General:  Alert, oriented and cooperative.   Mental Status: Normal mood and affect perceived. Normal judgment and thought content.  Rest of physical exam deferred due to type of encounter  Assessment and Plan:  Pregnancy: [id number] at [redacted]w[redacted]d 1. MCAD (medium-chain acyl-CoA dehydrogenase deficiency) ([place])   2. Alpha thalassemia silent carrier   3. History of classical cesarean section Delivery 36-37 weeks  4. History of IUFD F/u in MFM as recommended  5.  Maternal obesity affecting pregnancy, antepartum Ear pain reported, come to be seen to examine her ear  Preterm labor symptoms and general obstetric precautions including but not limited to vaginal bleeding, contractions, leaking of fluid and fetal movement were reviewed in detail with the patient.  I discussed the assessment and treatment plan with the patient. The patient was provided an opportunity to ask questions and all were answered. The patient agreed with the plan and demonstrated an understanding of the instructions. The patient was advised to call back or seek an in-person office evaluation/go to MAU at [facility] for any urgent or concerning symptoms. Please refer to After Visit Summary for other counseling recommendations.   I provided 12 minutes of non-face-to-face time during this encounter.  Return in about 4 weeks (around 02/05/2020) for needs 2 hr GTT.  Future Appointments  Date Time Provider [place]  02/03/2020  7:30 AM [facility] NURSE [facility]  02/03/2020  7:30 AM [facility] US3 [facility]    Emeterio Reeve, MD [facility] [place] [place]

## 2020-01-08 NOTE — Patient Instructions (Signed)

## 2020-01-24 DIAGNOSIS — Z03818 Encounter for observation for suspected exposure to other biological agents ruled out: Secondary | ICD-10-CM | POA: Diagnosis not present

## 2020-01-26 ENCOUNTER — Other Ambulatory Visit: Payer: Self-pay

## 2020-01-26 ENCOUNTER — Inpatient Hospital Stay (HOSPITAL_COMMUNITY)
Admission: AD | Admit: 2020-01-26 | Discharge: 2020-01-26 | Disposition: A | Discharge status: Home / Self Care | Payer: Medicaid Other | Attending: Family Medicine | Admitting: Family Medicine

## 2020-01-26 ENCOUNTER — Encounter (HOSPITAL_COMMUNITY): Payer: Self-pay | Admitting: Family Medicine

## 2020-01-26 DIAGNOSIS — Z3A26 26 weeks gestation of pregnancy: Secondary | ICD-10-CM

## 2020-01-26 DIAGNOSIS — R109 Unspecified abdominal pain: Secondary | ICD-10-CM | POA: Insufficient documentation

## 2020-01-26 DIAGNOSIS — O26892 Other specified pregnancy related conditions, second trimester: Secondary | ICD-10-CM | POA: Insufficient documentation

## 2020-01-26 DIAGNOSIS — R102 Pelvic and perineal pain unspecified side: Secondary | ICD-10-CM

## 2020-01-26 DIAGNOSIS — O99332 Smoking (tobacco) complicating pregnancy, second trimester: Secondary | ICD-10-CM | POA: Diagnosis not present

## 2020-01-26 DIAGNOSIS — F17210 Nicotine dependence, cigarettes, uncomplicated: Secondary | ICD-10-CM | POA: Diagnosis not present

## 2020-01-26 DIAGNOSIS — Z98891 History of uterine scar from previous surgery: Secondary | ICD-10-CM | POA: Insufficient documentation

## 2020-01-26 LAB — URINALYSIS, ROUTINE W REFLEX MICROSCOPIC
Bilirubin Urine: NEGATIVE
Glucose, UA: NEGATIVE mg/dL
Hgb urine dipstick: NEGATIVE
Ketones, ur: NEGATIVE mg/dL
Leukocytes,Ua: NEGATIVE
Nitrite: NEGATIVE
Protein, ur: 30 mg/dL — AB
Specific Gravity, Urine: 1.028 (ref 1.005–1.030)
pH: 6 (ref 5.0–8.0)

## 2020-01-26 MED ORDER — COMFORT FIT MATERNITY SUPP SM MISC
1.0000 [IU] | Freq: Every day | 0 refills | Status: DC | PRN
Start: 2020-01-26 — End: 2020-04-14

## 2020-01-26 NOTE — MAU Note (Signed)
Meredith Jackson is a 34 y.o. at [redacted]w[redacted]d here in MAU reporting: has been having sharp pressure pains in her lower right abdomen, sometimes feels the on the left side but mostly the right. Been going on for 3-4 days. Worse when she is moving. No bleeding, no discharge, no LOF. +FM, pt verbalizes concern for seizure activity from the fetus, states she has been having [place] and two times afterwards she felt some shaking inside for 15-20 seconds.   Onset of complaint: ongoing  Pain score: 4/10  Vitals:   01/26/20 1700  BP: 118/65  Pulse: 92  Resp: 16  Temp: 98.2 F (36.8 C)  SpO2: 100%     FHT: +FM  Lab orders placed from triage: UA

## 2020-01-26 NOTE — MAU Provider Note (Signed)
Chief Complaint:  Abdominal Pain   First Provider Initiated Contact with Patient 01/26/20 1727     HPI: Meredith Jackson is a 34 y.o. [id number] at 38w0dwho presents to maternity admissions reporting abdominal pain. Symptoms started 4 days ago. Reports lower abdominal pain that is sharp & alternates sides. Notices more when at work. Nothing makes pain worse but bending over improved pain. Also reports feeling some braxton hicks 1-2 times per hour but states that is separate from the sharp pains. Denies vaginal bleeding, dysuria, n/v/d, LOF. Good fetal movement.  Noticed episode of baby moving quickly after one of her braxton hicks contractions, lasted about 20 seconds.   Location: abdomen Quality: sharp Severity: 4/10 in pain scale Duration: 4 days Timing: intermittent Modifying factors: nothing makes worse. Lying down & bending over makes better Associated signs and symptoms: none  Pregnancy Course: [place]. H/o classical c/s x 2  Past Medical History:  Diagnosis Date  . Anxiety   . Depression   . Encounter for Nexplanon removal 09/12/2017   OB History  Gravida Para Term Preterm AB Living  '6 3 1 2 2 2  ' SAB TAB Ectopic Multiple Live Births  2       2    # Outcome Date GA Lbr Len/2nd Weight Sex Delivery Anes PTL Lv  6 Current           5 Preterm 03/09/09 357w0d F CS-Classical   LIV     Birth Comments: 36wks due to hx classical c/s     Complications: History of cesarean section, classical  4 Preterm 08/22/06 2677w0d67 g F CS-Classical   LIV     Birth Comments: 26wks Severe IUGR, reversed end diastolic flow     Complications: IUGR (intrauterine growth restriction) affecting care of mother, Oligohydramnios  3 Term 01/09/00 38w37w0d Vag-Spont   FD     Birth Comments: Stillbirth  2 SAB           1 SAB            Past Surgical History:  Procedure Laterality Date  . CESAREAN SECTION     x2   Family History  Problem Relation Age of Onset  . Hyperthyroidism Mother   . Diabetes  Mother    Social History   Tobacco Use  . Smoking status: Current Every Day Smoker    Packs/day: 0.25    Types: Cigarettes  . Smokeless tobacco: Never Used  Vaping Use  . Vaping Use: Never used  Substance Use Topics  . Alcohol use: Not Currently    Comment: Occasional  . Drug use: No   No Known Allergies No medications prior to admission.    I have reviewed patient's Past Medical Hx, Surgical Hx, Family Hx, Social Hx, medications and allergies.   ROS:  Review of Systems  Constitutional: Negative.   Gastrointestinal: Positive for abdominal pain. Negative for constipation, diarrhea, nausea and vomiting.  Genitourinary: Negative.     Physical Exam   Patient Vitals for the past 24 hrs:  BP Temp Temp src Pulse Resp SpO2 Height Weight  01/26/20 1809 122/67 -- -- 91 -- -- -- --  01/26/20 1700 118/65 98.2 F (36.8 C) Oral 92 16 100 % -- --  01/26/20 1650 -- -- -- -- -- -- 5' 3.5" (1.613 m) 85.2 kg    Constitutional: Well-developed, well-nourished female in no acute distress.  Cardiovascular: normal rate & rhythm, no murmur Respiratory: normal effort, lung sounds  clear throughout GI: Abd soft, non-tender, gravid appropriate for gestational age. Pos BS x 4 MS: Extremities nontender, no edema, normal ROM Neurologic: Alert and oriented x 4.  GU:  Dilation: Closed Effacement (%): Thick Cervical Position: Posterior Station: Ballotable Exam by:: Jorje Guild NP  Fetal Tracing:  Baseline: 150 Variability: moderate Accelerations: none Decelerations:none  Toco: none    Labs: Results for orders placed or performed during the hospital encounter of 01/26/20 (from the past 24 hour(s))  Urinalysis, Routine w reflex microscopic     Status: Abnormal   Collection Time: 01/26/20  4:53 PM  Result Value Ref Range   Color, Urine AMBER (A) YELLOW   APPearance HAZY (A) CLEAR   Specific Gravity, Urine 1.028 1.005 - 1.030   pH 6.0 5.0 - 8.0   Glucose, UA NEGATIVE NEGATIVE mg/dL    Hgb urine dipstick NEGATIVE NEGATIVE   Bilirubin Urine NEGATIVE NEGATIVE   Ketones, ur NEGATIVE NEGATIVE mg/dL   Protein, ur 30 (A) NEGATIVE mg/dL   Nitrite NEGATIVE NEGATIVE   Leukocytes,Ua NEGATIVE NEGATIVE   RBC / HPF 0-5 0 - 5 RBC/hpf   WBC, UA 0-5 0 - 5 WBC/hpf   Bacteria, UA RARE (A) NONE SEEN   Squamous Epithelial / LPF 0-5 0 - 5   Mucus PRESENT     Imaging:  No results found.  MAU Course: Orders Placed This Encounter  Procedures  . Urinalysis, Routine w reflex microscopic  . Discharge patient   Meds ordered this encounter  Medications  . Elastic Bandages & Supports (COMFORT FIT MATERNITY SUPP SM) MISC    Sig: 1 Units by Does not apply route daily as needed.    Dispense:  1 each    Refill:  0    Order Specific Question:   Supervising Provider    Answer:   Merrily Pew    MDM: Fetal tracing appropriate for gestational age. No contractions on toco. Abdomen soft & non tender.  Cervix closed/thick/ballotable.  Patient interested in maternity support belt.   Assessment: 1. Pain of round ligament affecting pregnancy, antepartum   2. [redacted] weeks gestation of pregnancy     Plan: Discharge home in stable condition.  Rx maternity support belt  Discussed reasons to return to [place]. Follow up.   Contact information: [address] [place] [address] [phone]               Allergies as of 01/26/2020   No Known Allergies     Medication List    STOP taking these medications   valACYclovir 1000 MG tablet Commonly known as: VALTREX     TAKE these medications   aspirin EC 81 MG tablet Take 1 tablet (81 mg total) by mouth daily. Take after 12 weeks for prevention of preeclampsia later in pregnancy   Blood Pressure Kit Devi 1 kit by Does not apply route as needed.   Comfort Fit Maternity Supp Sm Misc 1 Units by Does not apply route daily as needed.   prenatal vitamin  w/FE, FA 27-1 MG Tabs tablet Take 1 tablet by mouth daily at 12 noon.       Jorje Guild, NP 01/26/2020 6:54 PM

## 2020-01-26 NOTE — Discharge Instructions (Signed)
   PREGNANCY SUPPORT BELT: °You are not alone, Seventy-five percent of women have some sort of abdominal or back pain at some point in their pregnancy. Your baby is growing at a fast pace, which means that your whole body is rapidly trying to adjust to the changes. As your uterus grows, your back may start feeling a bit under stress and this can result in back or abdominal pain that can go from mild, and therefore bearable, to severe pains that will not allow you to sit or lay down comfortably, When it comes to dealing with pregnancy-related pains and cramps, some pregnant women usually prefer natural remedies, which the market is filled with nowadays. For example, wearing a pregnancy support belt can help ease and lessen your discomfort and pain. °WHAT ARE THE BENEFITS OF WEARING A PREGNANCY SUPPORT BELT? A pregnancy support belt provides support to the lower portion of the belly taking some of the weight of the growing uterus and distributing to the other parts of your body. It is designed make you comfortable and gives you extra support. Over the years, the pregnancy apparel market has been studying the needs and wants of pregnant women and they have come up with the most comfortable pregnancy support belts that woman could ever ask for. In fact, you will no longer have to wear a stretched-out or bulky pregnancy belt that is visible underneath your clothes and makes you feel even more uncomfortable. Nowadays, a pregnancy support belt is made of comfortable and stretchy materials that will not irritate your skin but will actually make you feel at ease and you will not even notice you are wearing it. They are easy to put on and adjust during the day and can be worn at night for additional support.  °BENEFITS: °• Relives Back pain °• Relieves Abdominal Muscle and Leg Pain °• Stabilizes the Pelvic Ring °• Offers a Cushioned Abdominal Lift Pad °• Relieves pressure on the Sciatic Nerve Within Minutes °WHERE TO GET  YOUR PREGNANCY BELT: [employer] [phone] @[address] [place], [place] [address] ° ° °

## 2020-01-27 DIAGNOSIS — O339 Maternal care for disproportion, unspecified: Secondary | ICD-10-CM | POA: Diagnosis not present

## 2020-02-03 ENCOUNTER — Ambulatory Visit: Payer: Medicaid Other | Admitting: *Deleted

## 2020-02-03 ENCOUNTER — Other Ambulatory Visit: Payer: Self-pay

## 2020-02-03 ENCOUNTER — Other Ambulatory Visit: Payer: Self-pay | Admitting: *Deleted

## 2020-02-03 ENCOUNTER — Ambulatory Visit: Payer: Medicaid Other | Attending: Obstetrics and Gynecology

## 2020-02-03 DIAGNOSIS — Z8759 Personal history of other complications of pregnancy, childbirth and the puerperium: Secondary | ICD-10-CM | POA: Diagnosis not present

## 2020-02-03 DIAGNOSIS — O99210 Obesity complicating pregnancy, unspecified trimester: Secondary | ICD-10-CM

## 2020-02-03 DIAGNOSIS — D259 Leiomyoma of uterus, unspecified: Secondary | ICD-10-CM | POA: Diagnosis not present

## 2020-02-03 DIAGNOSIS — Z98891 History of uterine scar from previous surgery: Secondary | ICD-10-CM

## 2020-02-03 DIAGNOSIS — O09292 Supervision of pregnancy with other poor reproductive or obstetric history, second trimester: Secondary | ICD-10-CM | POA: Diagnosis not present

## 2020-02-03 DIAGNOSIS — Z148 Genetic carrier of other disease: Secondary | ICD-10-CM | POA: Diagnosis not present

## 2020-02-03 DIAGNOSIS — O34219 Maternal care for unspecified type scar from previous cesarean delivery: Secondary | ICD-10-CM

## 2020-02-03 DIAGNOSIS — D563 Thalassemia minor: Secondary | ICD-10-CM

## 2020-02-03 DIAGNOSIS — O3412 Maternal care for benign tumor of corpus uteri, second trimester: Secondary | ICD-10-CM | POA: Diagnosis not present

## 2020-02-03 DIAGNOSIS — Z3A27 27 weeks gestation of pregnancy: Secondary | ICD-10-CM | POA: Diagnosis not present

## 2020-02-03 DIAGNOSIS — E71311 Medium chain acyl CoA dehydrogenase deficiency: Secondary | ICD-10-CM | POA: Diagnosis not present

## 2020-02-03 DIAGNOSIS — O09212 Supervision of pregnancy with history of pre-term labor, second trimester: Secondary | ICD-10-CM

## 2020-02-03 DIAGNOSIS — O99212 Obesity complicating pregnancy, second trimester: Secondary | ICD-10-CM

## 2020-02-03 DIAGNOSIS — Z362 Encounter for other antenatal screening follow-up: Secondary | ICD-10-CM | POA: Diagnosis not present

## 2020-02-03 DIAGNOSIS — E669 Obesity, unspecified: Secondary | ICD-10-CM

## 2020-02-05 ENCOUNTER — Ambulatory Visit (INDEPENDENT_AMBULATORY_CARE_PROVIDER_SITE_OTHER): Payer: Medicaid Other | Admitting: Obstetrics & Gynecology

## 2020-02-05 ENCOUNTER — Other Ambulatory Visit: Payer: Self-pay

## 2020-02-05 DIAGNOSIS — O09899 Supervision of other high risk pregnancies, unspecified trimester: Secondary | ICD-10-CM

## 2020-02-05 DIAGNOSIS — Z3A27 27 weeks gestation of pregnancy: Secondary | ICD-10-CM

## 2020-02-05 DIAGNOSIS — O09892 Supervision of other high risk pregnancies, second trimester: Secondary | ICD-10-CM

## 2020-02-05 MED ORDER — PANTOPRAZOLE SODIUM 20 MG PO TBEC: 20.0000 mg | DELAYED_RELEASE_TABLET | Freq: Every day | ORAL | 3 refills | Status: DC

## 2020-02-05 MED ORDER — ASPIRIN EC 81 MG PO TBEC: 81.0000 mg | DELAYED_RELEASE_TABLET | Freq: Every day | ORAL | 2 refills | Status: DC

## 2020-02-05 NOTE — Progress Notes (Signed)
   PRENATAL VISIT NOTE  Subjective:  Meredith Jackson is a 34 y.o. [id number] at [redacted]w[redacted]d being seen today for ongoing prenatal care.  She is currently monitored for the following issues for this high-risk pregnancy and has Supervision of other high risk pregnancy, antepartum; History of classical cesarean section; History of IUFD; Maternal obesity affecting pregnancy, antepartum; MCAD (medium-chain acyl-CoA dehydrogenase deficiency) ([place]); Alpha thalassemia silent carrier; Herpes infection during pregnancy, antepartum; and Fetal cardiac echogenic focus, antepartum on their problem list.  Patient reports heartburn and nausea.  Contractions: Not present. Vag. Bleeding: None.  Movement: Present. Denies leaking of fluid.   The following portions of the patient's history were reviewed and updated as appropriate: allergies, current medications, past family history, past medical history, past social history, past surgical history and problem list.   Objective:   Vitals:   02/05/20 0944  BP: 116/72  Pulse: 90  Weight: 189 lb 9.6 oz (86 kg)    Fetal Status: Fetal Heart Rate (bpm): 150 Fundal Height: 27 cm Movement: Present     General:  Alert, oriented and cooperative. Patient is in no acute distress.  Skin: Skin is warm and dry. No rash noted.   Cardiovascular: Normal heart rate noted  Respiratory: Normal respiratory effort, no problems with respiration noted  Abdomen: Soft, gravid, appropriate for gestational age.  Pain/Pressure: Present     Pelvic: Cervical exam deferred        Extremities: Normal range of motion.  Edema: None  Mental Status: Normal mood and affect. Normal behavior. Normal judgment and thought content.   Assessment and Plan:  Pregnancy: [id number] at [redacted]w[redacted]d 1. Supervision of other high risk pregnancy, antepartum Reflux sx - pantoprazole (PROTONIX) 20 MG tablet; Take 1 tablet (20 mg total) by mouth daily.  Dispense: 30 tablet; Refill: 3 - aspirin EC 81 MG tablet; Take 1 tablet (81 mg  total) by mouth daily. Take after 12 weeks for prevention of preeclampsia later in pregnancy  Dispense: 100 tablet; Refill: 2  Preterm labor symptoms and general obstetric precautions including but not limited to vaginal bleeding, contractions, leaking of fluid and fetal movement were reviewed in detail with the patient. Please refer to After Visit Summary for other counseling recommendations.   Return in about 2 weeks (around 02/19/2020) for 2 hr.  Future Appointments  Date Time Provider [place]  03/02/2020  9:15 AM [facility] NURSE [facility]  03/02/2020  9:15 AM [facility] US2 [facility]    Emeterio Reeve, MD

## 2020-02-05 NOTE — Progress Notes (Signed)
Pt is here for ROB, [redacted]w[redacted]d.

## 2020-02-05 NOTE — Patient Instructions (Signed)

## 2020-02-07 DIAGNOSIS — Z03818 Encounter for observation for suspected exposure to other biological agents ruled out: Secondary | ICD-10-CM | POA: Diagnosis not present

## 2020-02-20 ENCOUNTER — Other Ambulatory Visit: Payer: Self-pay

## 2020-02-20 ENCOUNTER — Encounter: Payer: Self-pay | Admitting: Obstetrics & Gynecology

## 2020-02-20 ENCOUNTER — Ambulatory Visit (INDEPENDENT_AMBULATORY_CARE_PROVIDER_SITE_OTHER): Payer: Medicaid Other | Admitting: Obstetrics & Gynecology

## 2020-02-20 ENCOUNTER — Encounter: Payer: Self-pay | Admitting: Obstetrics

## 2020-02-20 ENCOUNTER — Other Ambulatory Visit: Payer: Medicaid Other

## 2020-02-20 VITALS — BP 118/74 | HR 81 | Wt 191.0 lb

## 2020-02-20 DIAGNOSIS — Z03818 Encounter for observation for suspected exposure to other biological agents ruled out: Secondary | ICD-10-CM | POA: Diagnosis not present

## 2020-02-20 DIAGNOSIS — O09899 Supervision of other high risk pregnancies, unspecified trimester: Secondary | ICD-10-CM

## 2020-02-20 DIAGNOSIS — O34212 Maternal care for vertical scar from previous cesarean delivery: Secondary | ICD-10-CM

## 2020-02-20 DIAGNOSIS — O09293 Supervision of pregnancy with other poor reproductive or obstetric history, third trimester: Secondary | ICD-10-CM

## 2020-02-20 DIAGNOSIS — Z98891 History of uterine scar from previous surgery: Secondary | ICD-10-CM

## 2020-02-20 DIAGNOSIS — Z8759 Personal history of other complications of pregnancy, childbirth and the puerperium: Secondary | ICD-10-CM

## 2020-02-20 DIAGNOSIS — Z3A29 29 weeks gestation of pregnancy: Secondary | ICD-10-CM

## 2020-02-20 NOTE — Progress Notes (Signed)
Pt presents for 2 gtt labs Tdap declined

## 2020-02-20 NOTE — Progress Notes (Signed)
   PRENATAL VISIT NOTE  Subjective:  Meredith Jackson is a 34 y.o. [id number] at [redacted]w[redacted]d being seen today for ongoing prenatal care.  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  Patient reports no complaints.  Contractions: Not present. Vag. Bleeding: None.  Movement: Present. Denies leaking of fluid.   The following portions of the patient's history were reviewed and updated as appropriate: allergies, current medications, past family history, past medical history, past social history, past surgical history and problem list.   Objective:   Vitals:   02/20/20 0938  BP: 118/74  Pulse: 81  Weight: 191 lb (86.6 kg)    Fetal Status:     Movement: Present     General:  Alert, oriented and cooperative. Patient is in no acute distress.  Skin: Skin is warm and dry. No rash noted.   Cardiovascular: Normal heart rate noted  Respiratory: Normal respiratory effort, no problems with respiration noted  Abdomen: Soft, gravid, appropriate for gestational age.  Pain/Pressure: Present     Pelvic: Cervical exam deferred        Extremities: Normal range of motion.  Edema: None  Mental Status: Normal mood and affect. Normal behavior. Normal judgment and thought content.   Assessment and Plan:  Pregnancy: [id number] at [redacted]w[redacted]d 1. Supervision of other high risk pregnancy, antepartum History of IUFD  Supervision of other high risk pregnancy, antepartum  History of classical cesarean section    Preterm labor symptoms and general obstetric precautions including but not limited to vaginal bleeding,  contractions, leaking of fluid and fetal movement were reviewed in detail with the patient. Please refer to After Visit Summary for other counseling recommendations.   Return in about 2 weeks (around 03/05/2020).  Future Appointments  Date Time Provider [place]  03/02/2020  9:15 AM [facility] NURSE [facility]  03/02/2020  9:15 AM [facility] US2 [facility]    Emeterio Reeve, MD

## 2020-02-20 NOTE — Patient Instructions (Signed)

## 2020-02-21 LAB — RPR: RPR Ser Ql: NONREACTIVE

## 2020-02-21 LAB — CBC
Hematocrit: 33 % — ABNORMAL LOW (ref 34.0–46.6)
Hemoglobin: 10.8 g/dL — ABNORMAL LOW (ref 11.1–15.9)
MCH: 27.4 pg (ref 26.6–33.0)
MCHC: 32.7 g/dL (ref 31.5–35.7)
MCV: 84 fL (ref 79–97)
Platelets: 232 10*3/uL (ref 150–450)
RBC: 3.94 x10E6/uL (ref 3.77–5.28)
RDW: 12.5 % (ref 11.7–15.4)
WBC: 6.2 10*3/uL (ref 3.4–10.8)

## 2020-02-21 LAB — HIV ANTIBODY (ROUTINE TESTING W REFLEX): HIV Screen 4th Generation wRfx: NONREACTIVE

## 2020-02-21 LAB — GLUCOSE TOLERANCE, 2 HOURS W/ 1HR
Glucose, 1 hour: 187 mg/dL — ABNORMAL HIGH (ref 65–179)
Glucose, 2 hour: 77 mg/dL (ref 65–152)
Glucose, Fasting: 88 mg/dL (ref 65–91)

## 2020-02-25 ENCOUNTER — Other Ambulatory Visit: Payer: Self-pay

## 2020-02-25 ENCOUNTER — Other Ambulatory Visit: Payer: Self-pay | Admitting: Obstetrics & Gynecology

## 2020-02-25 DIAGNOSIS — O24419 Gestational diabetes mellitus in pregnancy, unspecified control: Secondary | ICD-10-CM | POA: Insufficient documentation

## 2020-02-25 MED ORDER — ACCU-CHEK GUIDE VI STRP: ORAL_STRIP | 12 refills | Status: DC

## 2020-02-25 MED ORDER — ACCU-CHEK GUIDE W/DEVICE KIT: 1.0000 | PACK | Freq: Four times a day (QID) | 0 refills | Status: DC

## 2020-02-25 MED ORDER — ACCU-CHEK SOFTCLIX LANCETS MISC: 1.0000 | Freq: Four times a day (QID) | 12 refills | Status: DC

## 2020-02-25 NOTE — Progress Notes (Signed)
I ordered f/u with diabetes education

## 2020-03-02 ENCOUNTER — Other Ambulatory Visit: Payer: Self-pay

## 2020-03-02 ENCOUNTER — Other Ambulatory Visit: Payer: Self-pay | Admitting: *Deleted

## 2020-03-02 ENCOUNTER — Ambulatory Visit: Payer: Medicaid Other | Attending: Obstetrics and Gynecology

## 2020-03-02 ENCOUNTER — Ambulatory Visit: Payer: Medicaid Other | Admitting: *Deleted

## 2020-03-02 DIAGNOSIS — Z148 Genetic carrier of other disease: Secondary | ICD-10-CM | POA: Diagnosis not present

## 2020-03-02 DIAGNOSIS — O99210 Obesity complicating pregnancy, unspecified trimester: Secondary | ICD-10-CM | POA: Insufficient documentation

## 2020-03-02 DIAGNOSIS — O09293 Supervision of pregnancy with other poor reproductive or obstetric history, third trimester: Secondary | ICD-10-CM

## 2020-03-02 DIAGNOSIS — O24410 Gestational diabetes mellitus in pregnancy, diet controlled: Secondary | ICD-10-CM

## 2020-03-02 DIAGNOSIS — D259 Leiomyoma of uterus, unspecified: Secondary | ICD-10-CM

## 2020-03-02 DIAGNOSIS — E71311 Medium chain acyl CoA dehydrogenase deficiency: Secondary | ICD-10-CM | POA: Diagnosis not present

## 2020-03-02 DIAGNOSIS — O3413 Maternal care for benign tumor of corpus uteri, third trimester: Secondary | ICD-10-CM | POA: Diagnosis not present

## 2020-03-02 DIAGNOSIS — Z3A31 31 weeks gestation of pregnancy: Secondary | ICD-10-CM

## 2020-03-02 DIAGNOSIS — O09213 Supervision of pregnancy with history of pre-term labor, third trimester: Secondary | ICD-10-CM

## 2020-03-02 DIAGNOSIS — D563 Thalassemia minor: Secondary | ICD-10-CM

## 2020-03-02 DIAGNOSIS — Z8759 Personal history of other complications of pregnancy, childbirth and the puerperium: Secondary | ICD-10-CM | POA: Insufficient documentation

## 2020-03-02 DIAGNOSIS — Z362 Encounter for other antenatal screening follow-up: Secondary | ICD-10-CM

## 2020-03-02 DIAGNOSIS — Z98891 History of uterine scar from previous surgery: Secondary | ICD-10-CM | POA: Insufficient documentation

## 2020-03-02 DIAGNOSIS — O34219 Maternal care for unspecified type scar from previous cesarean delivery: Secondary | ICD-10-CM

## 2020-03-02 DIAGNOSIS — O358XX0 Maternal care for other (suspected) fetal abnormality and damage, not applicable or unspecified: Secondary | ICD-10-CM

## 2020-03-04 ENCOUNTER — Ambulatory Visit (INDEPENDENT_AMBULATORY_CARE_PROVIDER_SITE_OTHER): Payer: Medicaid Other | Admitting: Obstetrics and Gynecology

## 2020-03-04 ENCOUNTER — Encounter: Payer: Self-pay | Admitting: Obstetrics

## 2020-03-04 ENCOUNTER — Other Ambulatory Visit: Payer: Self-pay

## 2020-03-04 ENCOUNTER — Encounter: Payer: Self-pay | Admitting: Obstetrics and Gynecology

## 2020-03-04 ENCOUNTER — Encounter: Payer: Medicaid Other | Attending: Obstetrics & Gynecology | Admitting: Registered"

## 2020-03-04 VITALS — BP 115/79 | HR 73 | Wt 190.2 lb

## 2020-03-04 DIAGNOSIS — Z8759 Personal history of other complications of pregnancy, childbirth and the puerperium: Secondary | ICD-10-CM

## 2020-03-04 DIAGNOSIS — Z3A31 31 weeks gestation of pregnancy: Secondary | ICD-10-CM

## 2020-03-04 DIAGNOSIS — O24419 Gestational diabetes mellitus in pregnancy, unspecified control: Secondary | ICD-10-CM | POA: Diagnosis not present

## 2020-03-04 DIAGNOSIS — O24410 Gestational diabetes mellitus in pregnancy, diet controlled: Secondary | ICD-10-CM

## 2020-03-04 DIAGNOSIS — O99210 Obesity complicating pregnancy, unspecified trimester: Secondary | ICD-10-CM

## 2020-03-04 DIAGNOSIS — O99213 Obesity complicating pregnancy, third trimester: Secondary | ICD-10-CM

## 2020-03-04 DIAGNOSIS — O34219 Maternal care for unspecified type scar from previous cesarean delivery: Secondary | ICD-10-CM

## 2020-03-04 DIAGNOSIS — O09293 Supervision of pregnancy with other poor reproductive or obstetric history, third trimester: Secondary | ICD-10-CM

## 2020-03-04 DIAGNOSIS — E669 Obesity, unspecified: Secondary | ICD-10-CM

## 2020-03-04 DIAGNOSIS — O09899 Supervision of other high risk pregnancies, unspecified trimester: Secondary | ICD-10-CM

## 2020-03-04 DIAGNOSIS — Z98891 History of uterine scar from previous surgery: Secondary | ICD-10-CM

## 2020-03-04 MED ORDER — COMFORT FIT MATERNITY SUPP LG MISC
0 refills | Status: DC
Start: 2020-03-04 — End: 2020-04-14

## 2020-03-04 NOTE — Progress Notes (Signed)
   PRENATAL VISIT NOTE  Subjective:  Meredith Jackson is a 34 y.o. [id number] at [redacted]w[redacted]d being seen today for ongoing prenatal care.  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  Patient reports backache.  Contractions: Irritability. Vag. Bleeding: None.  Movement: Present. Denies leaking of fluid.   The following portions of the patient's history were reviewed and updated as appropriate: allergies, current medications, past family history, past medical history, past social history, past surgical history and problem list.   Objective:   Vitals:   03/04/20 0911  BP: 115/79  Pulse: 73  Weight: 190 lb 3.2 oz (86.3 kg)    Fetal Status: Fetal Heart Rate (bpm): 140 Fundal Height: 31 cm Movement: Present     General:  Alert, oriented and cooperative. Patient is in no acute distress.  Skin: Skin is warm and dry. No rash noted.   Cardiovascular: Normal heart rate noted  Respiratory: Normal respiratory effort, no problems with respiration noted  Abdomen: Soft, gravid, appropriate for gestational age.  Pain/Pressure: Absent     Pelvic: Cervical exam deferred        Extremities: Normal range of motion.  Edema: None  Mental Status: Normal mood and affect. Normal behavior. Normal judgment and thought content.   Assessment and Plan:  Pregnancy: [id number] at [redacted]w[redacted]d 1. Supervision of other high risk pregnancy, antepartum Patient is doing well Rx maternity support belt provided Patient also referred to physical therapy  2. Diet controlled gestational diabetes mellitus (GDM) in  third trimester Patient meeting with diabetic educator today  3. History of IUFD Continue antenatal testing  4. History of classical cesarean section Scheduled for c-section at 37 weeks Plans Nexplanon for contraception  5. Maternal obesity affecting pregnancy, antepartum Continue ASA  Preterm labor symptoms and general obstetric precautions including but not limited to vaginal bleeding, contractions, leaking of fluid and fetal movement were reviewed in detail with the patient. Please refer to After Visit Summary for other counseling recommendations.   Return in about 2 weeks (around 03/18/2020) for in person, ROB, High risk.  Future Appointments  Date Time Provider [place]  03/04/2020  3:15 PM [place] [facility] NDM  03/10/2020  8:45 AM [facility] NURSE [facility]  03/10/2020  9:00 AM [facility] US1 [facility]  03/16/2020  9:15 AM [facility] NURSE [facility]  03/16/2020  9:30 AM [facility] US3 [facility]  03/18/2020  9:15 AM Griffin Basil, MD [facility] None  03/23/2020  9:15 AM [facility] NURSE [facility]  03/23/2020  9:30 AM [facility] US3 [facility]  03/30/2020  9:15 AM [facility] NURSE [facility]  03/30/2020  9:30 AM [facility] US3 [facility] [place]    Mora Bellman, MD

## 2020-03-05 ENCOUNTER — Encounter: Payer: Self-pay | Admitting: Registered"

## 2020-03-05 DIAGNOSIS — Z03818 Encounter for observation for suspected exposure to other biological agents ruled out: Secondary | ICD-10-CM | POA: Diagnosis not present

## 2020-03-05 NOTE — Progress Notes (Signed)
Patient was seen on 03/04/20 for Gestational Diabetes self-management class at [facility]. The following learning objectives were met by the patient during this course:   States the definition of Gestational Diabetes  States why dietary management is important in controlling blood glucose  Describes the effects each nutrient has on blood glucose levels  Demonstrates ability to create a balanced meal plan  Demonstrates carbohydrate counting   States when to check blood glucose levels  Demonstrates proper blood glucose monitoring techniques  States the effect of stress and exercise on blood glucose levels  States the importance of limiting caffeine and abstaining from alcohol and smoking  Blood glucose monitor: Patient brought her Accu-chek Guide Me meter to class: CBG: 88 mg/dL  Patient instructed to monitor glucose levels: FBS: 60 - <95; 1 hour: <140; 2 hour: <120  Patient received handouts:  Nutrition Diabetes and Pregnancy, including carb counting list  Patient will be seen for follow-up as needed.

## 2020-03-10 ENCOUNTER — Other Ambulatory Visit: Payer: Self-pay

## 2020-03-10 ENCOUNTER — Ambulatory Visit: Payer: Medicaid Other | Admitting: *Deleted

## 2020-03-10 ENCOUNTER — Ambulatory Visit: Payer: Medicaid Other | Attending: Obstetrics and Gynecology

## 2020-03-10 DIAGNOSIS — O3413 Maternal care for benign tumor of corpus uteri, third trimester: Secondary | ICD-10-CM

## 2020-03-10 DIAGNOSIS — O24410 Gestational diabetes mellitus in pregnancy, diet controlled: Secondary | ICD-10-CM | POA: Insufficient documentation

## 2020-03-10 DIAGNOSIS — O34219 Maternal care for unspecified type scar from previous cesarean delivery: Secondary | ICD-10-CM

## 2020-03-10 DIAGNOSIS — Z98891 History of uterine scar from previous surgery: Secondary | ICD-10-CM | POA: Diagnosis not present

## 2020-03-10 DIAGNOSIS — E71311 Medium chain acyl CoA dehydrogenase deficiency: Secondary | ICD-10-CM | POA: Insufficient documentation

## 2020-03-10 DIAGNOSIS — Z8759 Personal history of other complications of pregnancy, childbirth and the puerperium: Secondary | ICD-10-CM | POA: Diagnosis not present

## 2020-03-10 DIAGNOSIS — O09213 Supervision of pregnancy with history of pre-term labor, third trimester: Secondary | ICD-10-CM

## 2020-03-10 DIAGNOSIS — O99210 Obesity complicating pregnancy, unspecified trimester: Secondary | ICD-10-CM | POA: Insufficient documentation

## 2020-03-10 DIAGNOSIS — O358XX0 Maternal care for other (suspected) fetal abnormality and damage, not applicable or unspecified: Secondary | ICD-10-CM

## 2020-03-10 DIAGNOSIS — Z148 Genetic carrier of other disease: Secondary | ICD-10-CM

## 2020-03-10 DIAGNOSIS — Z362 Encounter for other antenatal screening follow-up: Secondary | ICD-10-CM

## 2020-03-10 DIAGNOSIS — D563 Thalassemia minor: Secondary | ICD-10-CM | POA: Diagnosis not present

## 2020-03-10 DIAGNOSIS — Z3A32 32 weeks gestation of pregnancy: Secondary | ICD-10-CM

## 2020-03-10 DIAGNOSIS — O09293 Supervision of pregnancy with other poor reproductive or obstetric history, third trimester: Secondary | ICD-10-CM | POA: Diagnosis not present

## 2020-03-10 DIAGNOSIS — D259 Leiomyoma of uterus, unspecified: Secondary | ICD-10-CM

## 2020-03-16 ENCOUNTER — Encounter: Payer: Self-pay | Admitting: *Deleted

## 2020-03-16 ENCOUNTER — Other Ambulatory Visit: Payer: Self-pay

## 2020-03-16 ENCOUNTER — Ambulatory Visit: Payer: Medicaid Other | Admitting: *Deleted

## 2020-03-16 ENCOUNTER — Ambulatory Visit: Payer: Medicaid Other | Attending: Obstetrics and Gynecology

## 2020-03-16 DIAGNOSIS — Z8759 Personal history of other complications of pregnancy, childbirth and the puerperium: Secondary | ICD-10-CM | POA: Diagnosis not present

## 2020-03-16 DIAGNOSIS — O99213 Obesity complicating pregnancy, third trimester: Secondary | ICD-10-CM

## 2020-03-16 DIAGNOSIS — O34219 Maternal care for unspecified type scar from previous cesarean delivery: Secondary | ICD-10-CM | POA: Diagnosis not present

## 2020-03-16 DIAGNOSIS — Z148 Genetic carrier of other disease: Secondary | ICD-10-CM

## 2020-03-16 DIAGNOSIS — O358XX0 Maternal care for other (suspected) fetal abnormality and damage, not applicable or unspecified: Secondary | ICD-10-CM | POA: Diagnosis not present

## 2020-03-16 DIAGNOSIS — O99210 Obesity complicating pregnancy, unspecified trimester: Secondary | ICD-10-CM | POA: Insufficient documentation

## 2020-03-16 DIAGNOSIS — O24410 Gestational diabetes mellitus in pregnancy, diet controlled: Secondary | ICD-10-CM

## 2020-03-16 DIAGNOSIS — O3413 Maternal care for benign tumor of corpus uteri, third trimester: Secondary | ICD-10-CM | POA: Diagnosis not present

## 2020-03-16 DIAGNOSIS — O09293 Supervision of pregnancy with other poor reproductive or obstetric history, third trimester: Secondary | ICD-10-CM

## 2020-03-16 DIAGNOSIS — D563 Thalassemia minor: Secondary | ICD-10-CM | POA: Diagnosis not present

## 2020-03-16 DIAGNOSIS — E669 Obesity, unspecified: Secondary | ICD-10-CM

## 2020-03-16 DIAGNOSIS — E71311 Medium chain acyl CoA dehydrogenase deficiency: Secondary | ICD-10-CM | POA: Insufficient documentation

## 2020-03-16 DIAGNOSIS — Z98891 History of uterine scar from previous surgery: Secondary | ICD-10-CM | POA: Insufficient documentation

## 2020-03-16 DIAGNOSIS — Z3A33 33 weeks gestation of pregnancy: Secondary | ICD-10-CM | POA: Diagnosis not present

## 2020-03-16 DIAGNOSIS — D259 Leiomyoma of uterus, unspecified: Secondary | ICD-10-CM | POA: Diagnosis not present

## 2020-03-18 ENCOUNTER — Other Ambulatory Visit: Payer: Self-pay

## 2020-03-18 ENCOUNTER — Ambulatory Visit (INDEPENDENT_AMBULATORY_CARE_PROVIDER_SITE_OTHER): Payer: Medicaid Other | Admitting: Obstetrics and Gynecology

## 2020-03-18 VITALS — BP 125/84 | HR 93

## 2020-03-18 DIAGNOSIS — O98519 Other viral diseases complicating pregnancy, unspecified trimester: Secondary | ICD-10-CM

## 2020-03-18 DIAGNOSIS — Z98891 History of uterine scar from previous surgery: Secondary | ICD-10-CM

## 2020-03-18 DIAGNOSIS — Z3A33 33 weeks gestation of pregnancy: Secondary | ICD-10-CM

## 2020-03-18 DIAGNOSIS — E71311 Medium chain acyl CoA dehydrogenase deficiency: Secondary | ICD-10-CM

## 2020-03-18 DIAGNOSIS — Z8759 Personal history of other complications of pregnancy, childbirth and the puerperium: Secondary | ICD-10-CM

## 2020-03-18 DIAGNOSIS — O358XX0 Maternal care for other (suspected) fetal abnormality and damage, not applicable or unspecified: Secondary | ICD-10-CM

## 2020-03-18 DIAGNOSIS — D563 Thalassemia minor: Secondary | ICD-10-CM

## 2020-03-18 DIAGNOSIS — O24410 Gestational diabetes mellitus in pregnancy, diet controlled: Secondary | ICD-10-CM

## 2020-03-18 DIAGNOSIS — O09899 Supervision of other high risk pregnancies, unspecified trimester: Secondary | ICD-10-CM

## 2020-03-18 DIAGNOSIS — O35BXX0 Maternal care for other (suspected) fetal abnormality and damage, fetal cardiac anomalies, not applicable or unspecified: Secondary | ICD-10-CM

## 2020-03-18 DIAGNOSIS — B009 Herpesviral infection, unspecified: Secondary | ICD-10-CM

## 2020-03-18 NOTE — Patient Instructions (Signed)
Gestational Diabetes Mellitus, Self Care When you have gestational diabetes (gestational diabetes mellitus), you must make sure your blood sugar (glucose) stays in a healthy range. You can do this with:  Nutrition.  Exercise.  Lifestyle changes.  Medicines or insulin, if needed.  Support from your doctors and others. If you get treated for this condition, it may not hurt you or your unborn baby (fetus). If you do not get treated for this condition, it may cause problems that can hurt you or your unborn baby. If you get gestational diabetes, you are:  More likely to get it if you get pregnant again.  More likely to develop type 2 diabetes in the future. How to stay aware of blood sugar   Check your blood sugar every day while you are pregnant. Check it as often as told.  Call your doctor if your blood sugar is above your goal numbers for two tests in a row. Your doctor will set personal treatment goals for you. Generally, you should have these blood sugar levels:  Before meals, or after not eating for a long time (fasting or preprandial): at or below 95 mg/dL (5.3 mmol/L).  After meals (postprandial): ? One hour after a meal: at or below 140 mg/dL (7.8 mmol/L). ? Two hours after a meal: at or below 120 mg/dL (6.7 mmol/L).  A1c (hemoglobin A1c) level: 6-6.5%. How to manage high and low blood sugar Signs of high blood sugar High blood sugar is called hyperglycemia. Know the early signs of high blood sugar. Signs may include:  Feeling: ? Thirsty. ? Hungry. ? Very tired.  Needing to pee (urinate) more than usual.  Blurry vision. Signs of low blood sugar Low blood sugar is called hypoglycemia. This is when blood sugar is at or below 70 mg/dL (3.9 mmol/L). Signs may include:  Feeling: ? Hungry. ? Worried or nervous (anxious). ? Sweaty and clammy. ? Confused. ? Dizzy. ? Sleepy. ? Sick to your stomach (nauseous).  Having: ? A fast heartbeat. ? A headache. ? A change  in your vision. ? Tingling or no feeling (numbness) around your mouth, lips, or tongue. ? Jerky movements that you cannot control (seizure).  Having trouble with: ? Moving (coordination). ? Sleeping. ? Passing out (fainting). ? Getting upset easily (irritability). Treating low blood sugar To treat low blood sugar, eat or drink something sugary right away. If you can think clearly and swallow safely, follow the 15:15 rule:  Take 15 grams of a fast-acting carb (carbohydrate). Talk with your doctor about how much you should take.  Some fast-acting carbs are: ? Sugar tablets (glucose pills). Take 3-4 glucose pills. ? 6-8 pieces of hard candy. ? 4-6 oz (120-150 mL) of fruit juice. ? 4-6 oz (120-150 mL) of regular (not diet) soda. ? 1 Tbsp (15 mL) honey or sugar.  Check your blood sugar 15 minutes after you take the carb.  If your blood sugar is still at or below 70 mg/dL (3.9 mmol/L), take 15 grams of a carb again.  If your blood sugar does not go above 70 mg/dL (3.9 mmol/L) after 3 tries, get help right away.  After your blood sugar goes back to normal, eat a meal or a snack within 1 hour. Treating very low blood sugar If your blood sugar is at or below 54 mg/dL (3 mmol/L), you have very low blood sugar (severe hypoglycemia). This is an emergency. Do not wait to see if the symptoms will go away. Get medical help right  away. Call your local emergency services (911 in the [place] If you have very low blood sugar and you cannot eat or drink, you may need a glucagon shot (injection). A family member or friend should learn how to check your blood sugar and how to give you a glucagon shot. Ask your doctor if you need to have a glucagon shot kit at home. Follow these instructions at home: Medicine  Take your insulin and diabetes medicines as told.  If your doctor says you should take more or less insulin or medicines, do this exactly as told.  Do not run out of insulin or  medicines. Food   Make healthy food choices. These include: ? Chicken, fish, egg whites, and beans. ? Oats, whole wheat, bulgur, brown rice, quinoa, and millet. ? Fresh fruits and vegetables. ? Low-fat dairy products. ? Nuts, avocado, olive oil, and canola oil.  Meet with a food specialist (dietitian). He or she can help you make an eating plan that is right for you.  Follow instructions from your doctor about what you cannot eat or drink.  Drink enough fluid to keep your pee (urine) pale yellow.  Eat healthy snacks between healthy meals.  Keep track of carbs that you eat. Do this by reading food labels and learning food serving sizes.  Follow your sick day plan when you cannot eat or drink normally. Make this plan with your doctor so it is ready to use. Activity  Exercise for 30 or more minutes a day, or as much as your doctor recommends.  Talk with your doctor before you start a new exercise or activity. Your doctor may need to tell you to change: ? How much insulin or medicines you take. ? How much food you eat. Lifestyle  Do not drink alcohol.  Do not use any tobacco products. These include cigarettes, chewing tobacco, and e-cigarettes. If you need help quitting, ask your doctor.  Learn how to deal with stress. If you need help with this, ask your doctor. Body care  Stay up to date with your shots (immunizations).  Brush your teeth and gums two times a day. Floss one or more times a day.  Go to the dentist one or more times every 6 months.  Stay at a healthy weight while you are pregnant. General instructions  Take over-the-counter and prescription medicines only as told by your doctor.  Ask your doctor about risks of high blood pressure in pregnancy (preeclampsia and eclampsia).  Share your diabetes care plan with: ? Your work or school. ? People you live with.  Check your pee for ketones: ? When you are sick. ? As told by your doctor.  Carry a card or  wear jewelry that says you have diabetes.  Keep all follow-up visits as told by your doctor. This is important. Care after giving birth  Have your blood sugar checked 4-12 weeks after you give birth.  Get checked for diabetes one or more times during 3 years. Questions to ask your doctor  Do I need to meet with a diabetes educator?  Where can I find a support group for people with gestational diabetes? Where to find more information To learn more about diabetes, visit:  American Diabetes Association: www.diabetes.org  Centers for Disease Control and Prevention (CDC): www.cdc.gov Summary  Check your blood sugar (glucose) every day while you are pregnant. Check it as often as told.  Take your insulin and diabetes medicines as told.  Keep all follow-up visits as   told by your doctor. This is important.  Have your blood sugar checked 4-12 weeks after you give birth. This information is not intended to replace advice given to you by your health care provider. Make sure you discuss any questions you have with your health care provider. Document Revised: 01/22/2018 Document Reviewed: 09/04/2015 Elsevier Patient Education  2020 Elsevier Inc.  

## 2020-03-18 NOTE — Progress Notes (Signed)
Please discuss good time to come out of work.

## 2020-03-18 NOTE — Progress Notes (Signed)
   PRENATAL VISIT NOTE  Subjective:  Meredith Jackson is a 34 y.o. [id number] at [redacted]w[redacted]d being seen today for ongoing prenatal care.  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  Patient doing well with no acute concerns today. She reports no complaints.  Contractions: Irregular. Vag. Bleeding: None.  Movement: Present. Denies leaking of fluid.   The following portions of the patient's history were reviewed and updated as appropriate: allergies, current medications, past family history, past medical history, past social history, past surgical history and problem list. Problem list updated.  Objective:   Vitals:   03/18/20 0925  BP: 125/84  Pulse: 93    Fetal Status: Fetal Heart Rate (bpm): 140   Movement: Present     General:  Alert, oriented and cooperative. Patient is in no acute distress.  Skin: Skin is warm and dry. No rash noted.   Cardiovascular: Normal heart rate noted  Respiratory: Normal respiratory effort, no problems with respiration noted  Abdomen: Soft, gravid, appropriate for gestational age.  Pain/Pressure: Present     Pelvic: Cervical exam deferred        Extremities: Normal range of motion.     Mental Status:  Normal mood and affect. Normal behavior. Normal judgment and thought content.   Assessment and Plan:  Pregnancy: E0P2330 at [redacted]w[redacted]d  1. Supervision of other high risk pregnancy, antepartum   2. History of classical cesarean section Repeat c/s scheduled at 37 weeks, no tubal  3. History of IUFD Last BPP  8/8  4. 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 Alpha thalassemia silent carrier   6. Herpes infection during pregnancy, antepartum Treat at 36 weeks  7. Fetal cardiac echogenic focus, antepartum, single or unspecified fetus   8. [redacted] weeks gestation of pregnancy  9.diet controlled gestational diabetes , pt did not bring in glucose sheets, but admits noncompliance with the diet FBS per pt 114 PPBS per pt 120 or less Pt is given 1 week to comply with diet and bring in blood sugar records next week, if noncompliant or blood sugars out of range, add metformin possibly only at night  Preterm labor symptoms and general obstetric precautions including but not limited to vaginal bleeding, contractions, leaking of fluid and fetal movement were reviewed in detail with the patient.  Please refer to After Visit Summary for other counseling recommendations.   Return in about 1 week (around 03/25/2020) for [facility], in person.   Lynnda Shields, MD

## 2020-03-19 DIAGNOSIS — Z03818 Encounter for observation for suspected exposure to other biological agents ruled out: Secondary | ICD-10-CM | POA: Diagnosis not present

## 2020-03-23 ENCOUNTER — Ambulatory Visit: Payer: Medicaid Other | Attending: Obstetrics and Gynecology

## 2020-03-23 ENCOUNTER — Encounter: Payer: Self-pay | Admitting: *Deleted

## 2020-03-23 ENCOUNTER — Ambulatory Visit: Payer: Medicaid Other | Admitting: *Deleted

## 2020-03-23 ENCOUNTER — Other Ambulatory Visit: Payer: Self-pay

## 2020-03-23 DIAGNOSIS — E71311 Medium chain acyl CoA dehydrogenase deficiency: Secondary | ICD-10-CM | POA: Diagnosis not present

## 2020-03-23 DIAGNOSIS — Z148 Genetic carrier of other disease: Secondary | ICD-10-CM

## 2020-03-23 DIAGNOSIS — O99210 Obesity complicating pregnancy, unspecified trimester: Secondary | ICD-10-CM | POA: Diagnosis not present

## 2020-03-23 DIAGNOSIS — O09293 Supervision of pregnancy with other poor reproductive or obstetric history, third trimester: Secondary | ICD-10-CM

## 2020-03-23 DIAGNOSIS — O358XX0 Maternal care for other (suspected) fetal abnormality and damage, not applicable or unspecified: Secondary | ICD-10-CM | POA: Diagnosis not present

## 2020-03-23 DIAGNOSIS — O09213 Supervision of pregnancy with history of pre-term labor, third trimester: Secondary | ICD-10-CM

## 2020-03-23 DIAGNOSIS — O24410 Gestational diabetes mellitus in pregnancy, diet controlled: Secondary | ICD-10-CM | POA: Insufficient documentation

## 2020-03-23 DIAGNOSIS — D259 Leiomyoma of uterus, unspecified: Secondary | ICD-10-CM

## 2020-03-23 DIAGNOSIS — Z8759 Personal history of other complications of pregnancy, childbirth and the puerperium: Secondary | ICD-10-CM | POA: Insufficient documentation

## 2020-03-23 DIAGNOSIS — O3413 Maternal care for benign tumor of corpus uteri, third trimester: Secondary | ICD-10-CM | POA: Diagnosis not present

## 2020-03-23 DIAGNOSIS — Z3A34 34 weeks gestation of pregnancy: Secondary | ICD-10-CM | POA: Diagnosis not present

## 2020-03-23 DIAGNOSIS — Z98891 History of uterine scar from previous surgery: Secondary | ICD-10-CM

## 2020-03-23 DIAGNOSIS — O34219 Maternal care for unspecified type scar from previous cesarean delivery: Secondary | ICD-10-CM

## 2020-03-23 DIAGNOSIS — Z362 Encounter for other antenatal screening follow-up: Secondary | ICD-10-CM

## 2020-03-23 DIAGNOSIS — D563 Thalassemia minor: Secondary | ICD-10-CM | POA: Insufficient documentation

## 2020-03-26 ENCOUNTER — Other Ambulatory Visit: Payer: Self-pay

## 2020-03-26 ENCOUNTER — Ambulatory Visit (INDEPENDENT_AMBULATORY_CARE_PROVIDER_SITE_OTHER): Payer: Medicaid Other | Admitting: Family Medicine

## 2020-03-26 VITALS — BP 115/76 | HR 90 | Wt 191.8 lb

## 2020-03-26 DIAGNOSIS — Z98891 History of uterine scar from previous surgery: Secondary | ICD-10-CM

## 2020-03-26 DIAGNOSIS — Z8751 Personal history of pre-term labor: Secondary | ICD-10-CM

## 2020-03-26 DIAGNOSIS — E71311 Medium chain acyl CoA dehydrogenase deficiency: Secondary | ICD-10-CM

## 2020-03-26 DIAGNOSIS — O09899 Supervision of other high risk pregnancies, unspecified trimester: Secondary | ICD-10-CM

## 2020-03-26 DIAGNOSIS — O98519 Other viral diseases complicating pregnancy, unspecified trimester: Secondary | ICD-10-CM

## 2020-03-26 DIAGNOSIS — O99210 Obesity complicating pregnancy, unspecified trimester: Secondary | ICD-10-CM

## 2020-03-26 DIAGNOSIS — D563 Thalassemia minor: Secondary | ICD-10-CM

## 2020-03-26 DIAGNOSIS — O24410 Gestational diabetes mellitus in pregnancy, diet controlled: Secondary | ICD-10-CM

## 2020-03-26 DIAGNOSIS — B009 Herpesviral infection, unspecified: Secondary | ICD-10-CM

## 2020-03-26 DIAGNOSIS — Z23 Encounter for immunization: Secondary | ICD-10-CM | POA: Diagnosis not present

## 2020-03-26 DIAGNOSIS — O358XX0 Maternal care for other (suspected) fetal abnormality and damage, not applicable or unspecified: Secondary | ICD-10-CM

## 2020-03-26 DIAGNOSIS — O35BXX0 Maternal care for other (suspected) fetal abnormality and damage, fetal cardiac anomalies, not applicable or unspecified: Secondary | ICD-10-CM

## 2020-03-26 DIAGNOSIS — Z8759 Personal history of other complications of pregnancy, childbirth and the puerperium: Secondary | ICD-10-CM

## 2020-03-26 DIAGNOSIS — Z3493 Encounter for supervision of normal pregnancy, unspecified, third trimester: Secondary | ICD-10-CM

## 2020-03-26 MED ORDER — VALACYCLOVIR HCL 500 MG PO TABS: 500.0000 mg | ORAL_TABLET | Freq: Two times a day (BID) | ORAL | 0 refills | Status: DC

## 2020-03-26 NOTE — Progress Notes (Signed)
Pt is here for ROB, [redacted]w[redacted]d. Pt reports that her BG levels have been within normal limits. Pt reports fasting BG level of 96.

## 2020-03-26 NOTE — Progress Notes (Signed)
   PRENATAL VISIT NOTE  Subjective:  Meredith Jackson is a 34 y.o. [id number] at [redacted]w[redacted]d being seen today for ongoing prenatal care.  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  Patient doing well with no acute concerns today. She reports no complaints.  Contractions: Not present. Vag. Bleeding: None.  Movement: Present. Denies leaking of fluid.   GDM: taking bASA daily, FBGL 80-90s, PPBGL 90s.  The following portions of the patient's history were reviewed and updated as appropriate: allergies, current medications, past family history, past medical history, past social history, past surgical history and problem list. Problem list updated.  Objective:   Vitals:   03/26/20 0938  BP: 115/76  Pulse: 90  Weight: 191 lb 12.8 oz (87 kg)    Fetal Status: Fetal Heart Rate (bpm): 158   Movement: Present     General:  Alert, oriented and cooperative. Patient is in no acute distress.  Skin: Skin is warm and dry. No rash noted.   Cardiovascular: Normal heart rate noted  Respiratory: Normal respiratory effort, no problems with respiration noted  Abdomen: Soft, gravid, appropriate for gestational age.  Pain/Pressure: Absent     Pelvic: Cervical exam deferred        Extremities: Normal range of motion.  Edema: None  Mental Status:  Normal mood and affect. Normal behavior. Normal judgment and thought content.   Assessment and Plan:  Pregnancy: [id number] at [redacted]w[redacted]d  Supervision of other high risk pregnancy,  antepartum History of classical cesarean section Doing well without complaints. C/s scheduled 04/12/20.  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  Herpes infection during pregnancy, antepartum Will initiate valtrex today, rx sent to pharmacy.  Fetal cardiac echogenic focus, antepartum, single or unspecified fetus  Diet controlled gestational diabetes mellitus (GDM) in third trimester BGL well controlled, patient has made diet modifications. BPPs scheduled.  History of preterm delivery History of prior pregnancy with IUGR newborn History of IUFD   Preterm labor symptoms and general obstetric precautions including but not limited to vaginal bleeding, contractions, leaking of fluid and fetal movement were reviewed in detail with the patient.  Please refer to After Visit Summary for other counseling recommendations.   No follow-ups on file.   Arrie Senate, MD OB Fellow, [place] [facility] [place] 03/26/2020 10:06 AM

## 2020-03-30 ENCOUNTER — Other Ambulatory Visit: Payer: Self-pay

## 2020-03-30 ENCOUNTER — Ambulatory Visit: Payer: Medicaid Other | Admitting: *Deleted

## 2020-03-30 ENCOUNTER — Ambulatory Visit: Payer: Medicaid Other | Attending: Obstetrics and Gynecology

## 2020-03-30 ENCOUNTER — Encounter: Payer: Self-pay | Admitting: *Deleted

## 2020-03-30 ENCOUNTER — Other Ambulatory Visit: Payer: Self-pay | Admitting: *Deleted

## 2020-03-30 DIAGNOSIS — O99213 Obesity complicating pregnancy, third trimester: Secondary | ICD-10-CM

## 2020-03-30 DIAGNOSIS — O09293 Supervision of pregnancy with other poor reproductive or obstetric history, third trimester: Secondary | ICD-10-CM | POA: Diagnosis not present

## 2020-03-30 DIAGNOSIS — O09213 Supervision of pregnancy with history of pre-term labor, third trimester: Secondary | ICD-10-CM | POA: Diagnosis not present

## 2020-03-30 DIAGNOSIS — Z3A35 35 weeks gestation of pregnancy: Secondary | ICD-10-CM

## 2020-03-30 DIAGNOSIS — Z98891 History of uterine scar from previous surgery: Secondary | ICD-10-CM | POA: Diagnosis not present

## 2020-03-30 DIAGNOSIS — O99210 Obesity complicating pregnancy, unspecified trimester: Secondary | ICD-10-CM | POA: Insufficient documentation

## 2020-03-30 DIAGNOSIS — O24410 Gestational diabetes mellitus in pregnancy, diet controlled: Secondary | ICD-10-CM | POA: Diagnosis not present

## 2020-03-30 DIAGNOSIS — O34219 Maternal care for unspecified type scar from previous cesarean delivery: Secondary | ICD-10-CM | POA: Diagnosis not present

## 2020-03-30 DIAGNOSIS — Z8759 Personal history of other complications of pregnancy, childbirth and the puerperium: Secondary | ICD-10-CM

## 2020-03-30 DIAGNOSIS — E669 Obesity, unspecified: Secondary | ICD-10-CM | POA: Diagnosis not present

## 2020-03-30 DIAGNOSIS — E71311 Medium chain acyl CoA dehydrogenase deficiency: Secondary | ICD-10-CM | POA: Insufficient documentation

## 2020-03-30 DIAGNOSIS — D563 Thalassemia minor: Secondary | ICD-10-CM | POA: Insufficient documentation

## 2020-03-30 DIAGNOSIS — O3413 Maternal care for benign tumor of corpus uteri, third trimester: Secondary | ICD-10-CM

## 2020-03-30 DIAGNOSIS — D259 Leiomyoma of uterus, unspecified: Secondary | ICD-10-CM | POA: Diagnosis not present

## 2020-03-30 DIAGNOSIS — Z148 Genetic carrier of other disease: Secondary | ICD-10-CM | POA: Diagnosis not present

## 2020-03-30 DIAGNOSIS — Z362 Encounter for other antenatal screening follow-up: Secondary | ICD-10-CM | POA: Diagnosis not present

## 2020-04-01 ENCOUNTER — Other Ambulatory Visit (HOSPITAL_COMMUNITY)
Admission: RE | Admit: 2020-04-01 | Discharge: 2020-04-01 | Disposition: A | Discharge status: Home / Self Care | Payer: Medicaid Other | Source: Ambulatory Visit | Attending: Obstetrics and Gynecology | Admitting: Obstetrics and Gynecology

## 2020-04-01 ENCOUNTER — Encounter: Payer: Self-pay | Admitting: Obstetrics and Gynecology

## 2020-04-01 ENCOUNTER — Other Ambulatory Visit: Payer: Self-pay

## 2020-04-01 ENCOUNTER — Ambulatory Visit (INDEPENDENT_AMBULATORY_CARE_PROVIDER_SITE_OTHER): Payer: Medicaid Other | Admitting: Obstetrics and Gynecology

## 2020-04-01 VITALS — BP 110/77 | HR 96 | Wt 196.5 lb

## 2020-04-01 DIAGNOSIS — E71311 Medium chain acyl CoA dehydrogenase deficiency: Secondary | ICD-10-CM

## 2020-04-01 DIAGNOSIS — Z3A35 35 weeks gestation of pregnancy: Secondary | ICD-10-CM | POA: Insufficient documentation

## 2020-04-01 DIAGNOSIS — B009 Herpesviral infection, unspecified: Secondary | ICD-10-CM

## 2020-04-01 DIAGNOSIS — O09899 Supervision of other high risk pregnancies, unspecified trimester: Secondary | ICD-10-CM | POA: Diagnosis not present

## 2020-04-01 DIAGNOSIS — Z98891 History of uterine scar from previous surgery: Secondary | ICD-10-CM

## 2020-04-01 DIAGNOSIS — O98519 Other viral diseases complicating pregnancy, unspecified trimester: Secondary | ICD-10-CM

## 2020-04-01 DIAGNOSIS — O35BXX0 Maternal care for other (suspected) fetal abnormality and damage, fetal cardiac anomalies, not applicable or unspecified: Secondary | ICD-10-CM

## 2020-04-01 DIAGNOSIS — O0990 Supervision of high risk pregnancy, unspecified, unspecified trimester: Secondary | ICD-10-CM | POA: Insufficient documentation

## 2020-04-01 DIAGNOSIS — Z8759 Personal history of other complications of pregnancy, childbirth and the puerperium: Secondary | ICD-10-CM

## 2020-04-01 DIAGNOSIS — Z8751 Personal history of pre-term labor: Secondary | ICD-10-CM

## 2020-04-01 DIAGNOSIS — D563 Thalassemia minor: Secondary | ICD-10-CM

## 2020-04-01 DIAGNOSIS — O358XX0 Maternal care for other (suspected) fetal abnormality and damage, not applicable or unspecified: Secondary | ICD-10-CM

## 2020-04-01 NOTE — Progress Notes (Signed)
   PRENATAL VISIT NOTE  Subjective:  Meredith Jackson is a 34 y.o. O3F2902 at [redacted]w[redacted]d being seen today for ongoing prenatal care.  She is currently monitored for the following issues for this high-risk pregnancy and has Supervision of other high risk pregnancy, antepartum; History of classical cesarean section; History of IUFD; Maternal obesity affecting pregnancy, antepartum; MCAD (medium-chain acyl-CoA dehydrogenase deficiency) ([place]); Alpha thalassemia silent carrier; Herpes infection during pregnancy, antepartum; Fetal cardiac echogenic focus, antepartum; Gestational diabetes; History of preterm delivery; History of prior pregnancy with IUGR newborn; and [redacted] weeks gestation of pregnancy on their problem list.  Patient doing well with no acute concerns today. She reports pelvic pressure.  Contractions: Not present. Vag. Bleeding: None.  Movement: Present. Denies leaking of fluid.   Pt did not bring in blood sugar report notes FBS: 93-96  PPBS: max of 105.  Pt advised to bring in blood sugars for final visit.  The following portions of the patient's history were reviewed and updated as appropriate: allergies, current medications, past family history, past medical history, past social history, past surgical history and problem list. Problem list updated.  Objective:   Vitals:   04/01/20 1042  BP: 110/77  Pulse: 96  Weight: 196 lb 8 oz (89.1 kg)    Fetal Status: Fetal Heart Rate (bpm): 148   Movement: Present  Presentation: Vertex  General:  Alert, oriented and cooperative. Patient is in no acute distress.  Skin: Skin is warm and dry. No rash noted.   Cardiovascular: Normal heart rate noted  Respiratory: Normal respiratory effort, no problems with respiration noted  Abdomen: Soft, gravid, appropriate for gestational age.  Pain/Pressure: Present     Pelvic: Cervical exam performed Dilation: Fingertip Effacement (%): 60 Station: -2  Extremities: Normal range of motion.  Edema: None  Mental  Status:  Normal mood and affect. Normal behavior. Normal judgment and thought content.   Assessment and Plan:  Pregnancy: [id number] at [redacted]w[redacted]d  1. Supervision of other high risk pregnancy, antepartum Continue routine care - Strep Gp B NAA - Cervicovaginal ancillary only( [place])  2. History of prior pregnancy with IUGR newborn Pt has BPP on 8/23  3. History of preterm delivery No s/sx of preterm labor  4. Fetal cardiac echogenic focus, antepartum, single or unspecified fetus   5. Alpha thalassemia silent carrier Genetic counseling performed  6. MCAD (medium-chain acyl-CoA dehydrogenase deficiency) ([place])   7. History of IUFD Delivery at 37 weeks, pt scheduled 8/29  8. History of classical cesarean section As above  9. Herpes infection during pregnancy, antepartum Continue prophylaxis  10. [redacted] weeks gestation of pregnancy   Preterm labor symptoms and general obstetric precautions including but not limited to vaginal bleeding, contractions, leaking of fluid and fetal movement were reviewed in detail with the patient.  Please refer to After Visit Summary for other counseling recommendations.   Return in about 1 week (around 04/08/2020) for [facility], in person.   Lynnda Shields, MD

## 2020-04-01 NOTE — Patient Instructions (Signed)

## 2020-04-01 NOTE — Progress Notes (Signed)
ROB  CC: Back pain, hip pain pt states everything hurts and swelling in feet. Pt states she feels as if the baby is trying to come out"

## 2020-04-02 DIAGNOSIS — Z03818 Encounter for observation for suspected exposure to other biological agents ruled out: Secondary | ICD-10-CM | POA: Diagnosis not present

## 2020-04-02 LAB — CERVICOVAGINAL ANCILLARY ONLY
Chlamydia: NEGATIVE
Comment: NEGATIVE
Comment: NEGATIVE
Comment: NORMAL
Neisseria Gonorrhea: NEGATIVE
Trichomonas: NEGATIVE

## 2020-04-03 LAB — STREP GP B NAA: Strep Gp B NAA: NEGATIVE

## 2020-04-06 ENCOUNTER — Encounter: Payer: Self-pay | Admitting: *Deleted

## 2020-04-06 ENCOUNTER — Ambulatory Visit: Payer: Medicaid Other | Attending: Obstetrics and Gynecology

## 2020-04-06 ENCOUNTER — Ambulatory Visit: Payer: Medicaid Other | Admitting: *Deleted

## 2020-04-06 ENCOUNTER — Other Ambulatory Visit: Payer: Self-pay

## 2020-04-06 DIAGNOSIS — E71311 Medium chain acyl CoA dehydrogenase deficiency: Secondary | ICD-10-CM | POA: Insufficient documentation

## 2020-04-06 DIAGNOSIS — O34219 Maternal care for unspecified type scar from previous cesarean delivery: Secondary | ICD-10-CM | POA: Diagnosis not present

## 2020-04-06 DIAGNOSIS — Z3A36 36 weeks gestation of pregnancy: Secondary | ICD-10-CM | POA: Diagnosis not present

## 2020-04-06 DIAGNOSIS — O99210 Obesity complicating pregnancy, unspecified trimester: Secondary | ICD-10-CM | POA: Diagnosis not present

## 2020-04-06 DIAGNOSIS — D259 Leiomyoma of uterus, unspecified: Secondary | ICD-10-CM | POA: Diagnosis not present

## 2020-04-06 DIAGNOSIS — O3413 Maternal care for benign tumor of corpus uteri, third trimester: Secondary | ICD-10-CM | POA: Diagnosis not present

## 2020-04-06 DIAGNOSIS — Z98891 History of uterine scar from previous surgery: Secondary | ICD-10-CM

## 2020-04-06 DIAGNOSIS — O09293 Supervision of pregnancy with other poor reproductive or obstetric history, third trimester: Secondary | ICD-10-CM | POA: Diagnosis not present

## 2020-04-06 DIAGNOSIS — Z8759 Personal history of other complications of pregnancy, childbirth and the puerperium: Secondary | ICD-10-CM

## 2020-04-06 DIAGNOSIS — E669 Obesity, unspecified: Secondary | ICD-10-CM

## 2020-04-06 DIAGNOSIS — O99213 Obesity complicating pregnancy, third trimester: Secondary | ICD-10-CM

## 2020-04-06 DIAGNOSIS — D563 Thalassemia minor: Secondary | ICD-10-CM | POA: Diagnosis not present

## 2020-04-06 DIAGNOSIS — Z148 Genetic carrier of other disease: Secondary | ICD-10-CM | POA: Diagnosis not present

## 2020-04-06 DIAGNOSIS — O09213 Supervision of pregnancy with history of pre-term labor, third trimester: Secondary | ICD-10-CM

## 2020-04-07 ENCOUNTER — Encounter (HOSPITAL_COMMUNITY): Payer: Self-pay

## 2020-04-07 NOTE — Patient Instructions (Signed)
Meredith Jackson  04/07/2020   Your procedure is scheduled on:  04/12/2020  Arrive at 34 at Entrance C on [address] [facility]  and [address]. You are invited to use the FREE valet parking or use the Visitor's parking deck.  Pick up the phone at the desk and dial [phone].  Call this number if you have problems the morning of surgery: [phone]  Remember:   Do not eat food:(After Midnight) Desps de medianoche.  Do not drink clear liquids: (After Midnight) Desps de medianoche.  Take these medicines the morning of surgery with A SIP OF WATER:  Take your valtrex as prescribed and you may take your protonix if desired   Do not wear jewelry, make-up or nail polish.  Do not wear lotions, powders, or perfumes. Do not wear deodorant.  Do not shave 48 hours prior to surgery.  Do not bring valuables to the hospital.  [facility] is not   responsible for any belongings or valuables brought to the hospital.  Contacts, dentures or bridgework may not be worn into surgery.  Leave suitcase in the car. After surgery it may be brought to your room.  For patients admitted to the hospital, checkout time is 11:00 AM the day of              discharge.      Please read over the following fact sheets that you were given:     Preparing for Surgery

## 2020-04-08 ENCOUNTER — Other Ambulatory Visit: Payer: Self-pay

## 2020-04-08 ENCOUNTER — Ambulatory Visit (INDEPENDENT_AMBULATORY_CARE_PROVIDER_SITE_OTHER): Payer: Medicaid Other | Admitting: Obstetrics and Gynecology

## 2020-04-08 ENCOUNTER — Encounter: Payer: Self-pay | Admitting: Obstetrics and Gynecology

## 2020-04-08 VITALS — BP 121/80 | HR 88 | Wt 195.2 lb

## 2020-04-08 DIAGNOSIS — Z98891 History of uterine scar from previous surgery: Secondary | ICD-10-CM

## 2020-04-08 DIAGNOSIS — O09899 Supervision of other high risk pregnancies, unspecified trimester: Secondary | ICD-10-CM

## 2020-04-08 DIAGNOSIS — O24410 Gestational diabetes mellitus in pregnancy, diet controlled: Secondary | ICD-10-CM

## 2020-04-08 DIAGNOSIS — O98519 Other viral diseases complicating pregnancy, unspecified trimester: Secondary | ICD-10-CM

## 2020-04-08 DIAGNOSIS — B009 Herpesviral infection, unspecified: Secondary | ICD-10-CM

## 2020-04-08 NOTE — Progress Notes (Signed)
Patient reports fetal movement, denies pain today. Pt states that she has not checked BG in a week, had a death in the family and has had a lot going on.

## 2020-04-08 NOTE — Patient Instructions (Signed)
Cesarean Delivery, Care After This sheet gives you information about how to care for yourself after your procedure. Your health care provider may also give you more specific instructions. If you have problems or questions, contact your health care provider. What can I expect after the procedure? After the procedure, it is common to have:  A small amount of blood or clear fluid coming from the incision.  Some redness, swelling, and pain in your incision area.  Some abdominal pain and soreness.  Vaginal bleeding (lochia). Even though you did not have a vaginal delivery, you will still have vaginal bleeding and discharge.  Pelvic cramps.  Fatigue. You may have pain, swelling, and discomfort in the tissue between your vagina and your anus (perineum) if:  Your C-section was unplanned, and you were allowed to labor and push.  An incision was made in the area (episiotomy) or the tissue tore during attempted vaginal delivery. Follow these instructions at home: Incision care   Follow instructions from your health care provider about how to take care of your incision. Make sure you: ? Wash your hands with soap and water before you change your bandage (dressing). If soap and water are not available, use hand sanitizer. ? If you have a dressing, change it or remove it as told by your health care provider. ? Leave stitches (sutures), skin staples, skin glue, or adhesive strips in place. These skin closures may need to stay in place for 2 weeks or longer. If adhesive strip edges start to loosen and curl up, you may trim the loose edges. Do not remove adhesive strips completely unless your health care provider tells you to do that.  Check your incision area every day for signs of infection. Check for: ? More redness, swelling, or pain. ? More fluid or blood. ? Warmth. ? Pus or a bad smell.  Do not take baths, swim, or use a hot tub until your health care provider says it's okay. Ask your health  care provider if you can take showers.  When you cough or sneeze, hug a pillow. This helps with pain and decreases the chance of your incision opening up (dehiscing). Do this until your incision heals. Medicines  Take over-the-counter and prescription medicines only as told by your health care provider.  If you were prescribed an antibiotic medicine, take it as told by your health care provider. Do not stop taking the antibiotic even if you start to feel better.  Do not drive or use heavy machinery while taking prescription pain medicine. Lifestyle  Do not drink alcohol. This is especially important if you are breastfeeding or taking pain medicine.  Do not use any products that contain nicotine or tobacco, such as cigarettes, e-cigarettes, and chewing tobacco. If you need help quitting, ask your health care provider. Eating and drinking  Drink at least 8 eight-ounce glasses of water every day unless told not to by your health care provider. If you breastfeed, you may need to drink even more water.  Eat high-fiber foods every day. These foods may help prevent or relieve constipation. High-fiber foods include: ? Whole grain cereals and breads. ? Brown rice. ? Beans. ? Fresh fruits and vegetables. Activity   If possible, have someone help you care for your baby and help with household activities for at least a few days after you leave the hospital.  Return to your normal activities as told by your health care provider. Ask your health care provider what activities are safe for   you.  Rest as much as possible. Try to rest or take a nap while your baby is sleeping.  Do not lift anything that is heavier than 10 lbs (4.5 kg), or the limit that you were told, until your health care provider says that it is safe.  Talk with your health care provider about when you can engage in sexual activity. This may depend on your: ? Risk of infection. ? How fast you heal. ? Comfort and desire to  engage in sexual activity. General instructions  Do not use tampons or douches until your health care provider approves.  Wear loose, comfortable clothing and a supportive and well-fitting bra.  Keep your perineum clean and dry. Wipe from front to back when you use the toilet.  If you pass a blood clot, save it and call your health care provider to discuss. Do not flush blood clots down the toilet before you get instructions from your health care provider.  Keep all follow-up visits for you and your baby as told by your health care provider. This is important. Contact a health care provider if:  You have: ? A fever. ? Bad-smelling vaginal discharge. ? Pus or a bad smell coming from your incision. ? Difficulty or pain when urinating. ? A sudden increase or decrease in the frequency of your bowel movements. ? More redness, swelling, or pain around your incision. ? More fluid or blood coming from your incision. ? A rash. ? Nausea. ? Little or no interest in activities you used to enjoy. ? Questions about caring for yourself or your baby.  Your incision feels warm to the touch.  Your breasts turn red or become painful or hard.  You feel unusually sad or worried.  You vomit.  You pass a blood clot from your vagina.  You urinate more than usual.  You are dizzy or light-headed. Get help right away if:  You have: ? Pain that does not go away or get better with medicine. ? Chest pain. ? Difficulty breathing. ? Blurred vision or spots in your vision. ? Thoughts about hurting yourself or your baby. ? New pain in your abdomen or in one of your legs. ? A severe headache.  You faint.  You bleed from your vagina so much that you fill more than one sanitary pad in one hour. Bleeding should not be heavier than your heaviest period. Summary  After the procedure, it is common to have pain at your incision site, abdominal cramping, and slight bleeding from your vagina.  Check  your incision area every day for signs of infection.  Tell your health care provider about any unusual symptoms.  Keep all follow-up visits for you and your baby as told by your health care provider. This information is not intended to replace advice given to you by your health care provider. Make sure you discuss any questions you have with your health care provider. Document Revised: 02/07/2018 Document Reviewed: 02/07/2018 Elsevier Patient Education  2020 [employer]vier Inc[employer]  

## 2020-04-08 NOTE — Progress Notes (Signed)
Subjective:  Meredith Jackson is a 34 y.o. Q7Y1950 at [redacted]w[redacted]d being seen today for ongoing prenatal care.  She is currently monitored for the following issues for this high-risk pregnancy and has Supervision of other high risk pregnancy, antepartum; History of classical cesarean section; History of IUFD; Maternal obesity affecting pregnancy, antepartum; MCAD (medium-chain acyl-CoA dehydrogenase deficiency) ([place]); Alpha thalassemia silent carrier; Herpes infection during pregnancy, antepartum; Fetal cardiac echogenic focus, antepartum; Gestational diabetes; History of preterm delivery; History of prior pregnancy with IUGR newborn; and [redacted] weeks gestation of pregnancy on their problem list.  Patient reports general discomforts of pregancy.  Contractions: Irregular. Vag. Bleeding: None.  Movement: Present. Denies leaking of fluid.   The following portions of the patient's history were reviewed and updated as appropriate: allergies, current medications, past family history, past medical history, past social history, past surgical history and problem list. Problem list updated.  Objective:   Vitals:   04/08/20 1009  BP: 121/80  Pulse: 88  Weight: 195 lb 3.2 oz (88.5 kg)    Fetal Status: Fetal Heart Rate (bpm): 138   Movement: Present     General:  Alert, oriented and cooperative. Patient is in no acute distress.  Skin: Skin is warm and dry. No rash noted.   Cardiovascular: Normal heart rate noted  Respiratory: Normal respiratory effort, no problems with respiration noted  Abdomen: Soft, gravid, appropriate for gestational age. Pain/Pressure: Absent     Pelvic:  Cervical exam deferred        Extremities: Normal range of motion.  Edema: None  Mental Status: Normal mood and affect. Normal behavior. Normal judgment and thought content.   Urinalysis:      Assessment and Plan:  Pregnancy: [id number] at [redacted]w[redacted]d  1. Supervision of other high risk pregnancy, antepartum Stable  2. Diet controlled gestational  diabetes mellitus (GDM) in third trimester Has not been checking CBG's the last week Pt encouraged to check CBG's and continue to follow diet  3. History of classical cesarean section For C section on 04/12/20  4. Herpes infection during pregnancy, antepartum Continue with Valtrex  Term labor symptoms and general obstetric precautions including but not limited to vaginal bleeding, contractions, leaking of fluid and fetal movement were reviewed in detail with the patient. Please refer to After Visit Summary for other counseling recommendations.  No follow-ups on file.   Chancy Milroy, MD

## 2020-04-10 ENCOUNTER — Other Ambulatory Visit (HOSPITAL_COMMUNITY)
Admission: RE | Admit: 2020-04-10 | Discharge: 2020-04-10 | Disposition: A | Discharge status: Home / Self Care | Payer: Medicaid Other | Source: Ambulatory Visit | Attending: Obstetrics & Gynecology | Admitting: Obstetrics & Gynecology

## 2020-04-10 ENCOUNTER — Other Ambulatory Visit: Payer: Self-pay

## 2020-04-10 DIAGNOSIS — Z20822 Contact with and (suspected) exposure to covid-19: Secondary | ICD-10-CM | POA: Insufficient documentation

## 2020-04-10 DIAGNOSIS — Z01812 Encounter for preprocedural laboratory examination: Secondary | ICD-10-CM | POA: Insufficient documentation

## 2020-04-10 LAB — CBC
HCT: 33 % — ABNORMAL LOW (ref 36.0–46.0)
Hemoglobin: 10.6 g/dL — ABNORMAL LOW (ref 12.0–15.0)
MCH: 27 pg (ref 26.0–34.0)
MCHC: 32.1 g/dL (ref 30.0–36.0)
MCV: 84 fL (ref 80.0–100.0)
Platelets: 183 10*3/uL (ref 150–400)
RBC: 3.93 MIL/uL (ref 3.87–5.11)
RDW: 13.3 % (ref 11.5–15.5)
WBC: 5.8 10*3/uL (ref 4.0–10.5)
nRBC: 0 % (ref 0.0–0.2)

## 2020-04-10 LAB — SARS CORONAVIRUS 2 BY RT PCR (HOSPITAL ORDER, PERFORMED IN ~~LOC~~ HOSPITAL LAB): SARS Coronavirus 2: NEGATIVE

## 2020-04-10 LAB — TYPE AND SCREEN
ABO/RH(D): O POS
Antibody Screen: NEGATIVE

## 2020-04-10 LAB — RPR: RPR Ser Ql: NONREACTIVE

## 2020-04-10 NOTE — MAU Note (Signed)
Covid swab collected. Pt asymptomatic. Waiting for CBC/RPR/Type and screen to be drawn

## 2020-04-12 ENCOUNTER — Inpatient Hospital Stay (HOSPITAL_COMMUNITY)
Admission: AD | Admit: 2020-04-12 | Discharge: 2020-04-14 | DRG: 787 | Disposition: A | Discharge status: Home / Self Care | Payer: Medicaid Other | Attending: Obstetrics & Gynecology | Admitting: Obstetrics & Gynecology

## 2020-04-12 ENCOUNTER — Encounter (HOSPITAL_COMMUNITY): Payer: Self-pay | Admitting: Obstetrics & Gynecology

## 2020-04-12 ENCOUNTER — Encounter (HOSPITAL_COMMUNITY)
Admission: AD | Disposition: A | Discharge status: Home / Self Care | Payer: Self-pay | Source: Home / Self Care | Attending: Obstetrics & Gynecology

## 2020-04-12 ENCOUNTER — Inpatient Hospital Stay (HOSPITAL_COMMUNITY): Payer: Medicaid Other | Admitting: Anesthesiology

## 2020-04-12 ENCOUNTER — Other Ambulatory Visit: Payer: Self-pay

## 2020-04-12 DIAGNOSIS — O9832 Other infections with a predominantly sexual mode of transmission complicating childbirth: Secondary | ICD-10-CM | POA: Diagnosis present

## 2020-04-12 DIAGNOSIS — A6000 Herpesviral infection of urogenital system, unspecified: Secondary | ICD-10-CM | POA: Diagnosis not present

## 2020-04-12 DIAGNOSIS — O99210 Obesity complicating pregnancy, unspecified trimester: Secondary | ICD-10-CM | POA: Diagnosis present

## 2020-04-12 DIAGNOSIS — E71311 Medium chain acyl CoA dehydrogenase deficiency: Secondary | ICD-10-CM | POA: Diagnosis not present

## 2020-04-12 DIAGNOSIS — O99334 Smoking (tobacco) complicating childbirth: Secondary | ICD-10-CM | POA: Diagnosis not present

## 2020-04-12 DIAGNOSIS — O99214 Obesity complicating childbirth: Secondary | ICD-10-CM | POA: Diagnosis not present

## 2020-04-12 DIAGNOSIS — F17210 Nicotine dependence, cigarettes, uncomplicated: Secondary | ICD-10-CM | POA: Diagnosis not present

## 2020-04-12 DIAGNOSIS — B009 Herpesviral infection, unspecified: Secondary | ICD-10-CM | POA: Diagnosis present

## 2020-04-12 DIAGNOSIS — O9081 Anemia of the puerperium: Secondary | ICD-10-CM | POA: Diagnosis not present

## 2020-04-12 DIAGNOSIS — O358XX0 Maternal care for other (suspected) fetal abnormality and damage, not applicable or unspecified: Secondary | ICD-10-CM | POA: Diagnosis present

## 2020-04-12 DIAGNOSIS — O24420 Gestational diabetes mellitus in childbirth, diet controlled: Secondary | ICD-10-CM | POA: Diagnosis not present

## 2020-04-12 DIAGNOSIS — O35BXX0 Maternal care for other (suspected) fetal abnormality and damage, fetal cardiac anomalies, not applicable or unspecified: Secondary | ICD-10-CM | POA: Diagnosis present

## 2020-04-12 DIAGNOSIS — Z30017 Encounter for initial prescription of implantable subdermal contraceptive: Secondary | ICD-10-CM | POA: Diagnosis not present

## 2020-04-12 DIAGNOSIS — O99284 Endocrine, nutritional and metabolic diseases complicating childbirth: Secondary | ICD-10-CM | POA: Diagnosis present

## 2020-04-12 DIAGNOSIS — Z3A37 37 weeks gestation of pregnancy: Secondary | ICD-10-CM

## 2020-04-12 DIAGNOSIS — O24429 Gestational diabetes mellitus in childbirth, unspecified control: Secondary | ICD-10-CM | POA: Diagnosis not present

## 2020-04-12 DIAGNOSIS — D62 Acute posthemorrhagic anemia: Secondary | ICD-10-CM | POA: Diagnosis not present

## 2020-04-12 DIAGNOSIS — Z98891 History of uterine scar from previous surgery: Secondary | ICD-10-CM

## 2020-04-12 DIAGNOSIS — O34212 Maternal care for vertical scar from previous cesarean delivery: Secondary | ICD-10-CM | POA: Diagnosis not present

## 2020-04-12 DIAGNOSIS — D563 Thalassemia minor: Secondary | ICD-10-CM | POA: Diagnosis present

## 2020-04-12 DIAGNOSIS — O09899 Supervision of other high risk pregnancies, unspecified trimester: Secondary | ICD-10-CM

## 2020-04-12 DIAGNOSIS — O34211 Maternal care for low transverse scar from previous cesarean delivery: Secondary | ICD-10-CM | POA: Diagnosis not present

## 2020-04-12 DIAGNOSIS — O24419 Gestational diabetes mellitus in pregnancy, unspecified control: Secondary | ICD-10-CM | POA: Diagnosis present

## 2020-04-12 DIAGNOSIS — Z8759 Personal history of other complications of pregnancy, childbirth and the puerperium: Secondary | ICD-10-CM

## 2020-04-12 LAB — CBC
HCT: 35.9 % — ABNORMAL LOW (ref 36.0–46.0)
Hemoglobin: 11.3 g/dL — ABNORMAL LOW (ref 12.0–15.0)
MCH: 26.7 pg (ref 26.0–34.0)
MCHC: 31.5 g/dL (ref 30.0–36.0)
MCV: 84.7 fL (ref 80.0–100.0)
Platelets: 204 10*3/uL (ref 150–400)
RBC: 4.24 MIL/uL (ref 3.87–5.11)
RDW: 13.7 % (ref 11.5–15.5)
WBC: 6.3 10*3/uL (ref 4.0–10.5)
nRBC: 0 % (ref 0.0–0.2)

## 2020-04-12 LAB — TYPE AND SCREEN
ABO/RH(D): O POS
Antibody Screen: NEGATIVE

## 2020-04-12 LAB — RPR: RPR Ser Ql: NONREACTIVE

## 2020-04-12 LAB — GLUCOSE, CAPILLARY: Glucose-Capillary: 97 mg/dL (ref 70–99)

## 2020-04-12 SURGERY — Surgical Case
Anesthesia: Spinal

## 2020-04-12 MED ORDER — CEFAZOLIN SODIUM-DEXTROSE 2-4 GM/100ML-% IV SOLN: 2.0000 g | INTRAVENOUS | Status: DC

## 2020-04-12 MED ORDER — KETOROLAC TROMETHAMINE 30 MG/ML IJ SOLN: 30.0000 mg | Freq: Once | INTRAMUSCULAR | Status: DC

## 2020-04-12 MED ORDER — SIMETHICONE 80 MG PO CHEW
80.0000 mg | CHEWABLE_TABLET | Freq: Three times a day (TID) | ORAL | Status: DC
  Administered 2020-04-12 – 2020-04-14 (×4): 80 mg via ORAL
  Filled 2020-04-12 (×4): qty 1

## 2020-04-12 MED ORDER — FENTANYL CITRATE (PF) 100 MCG/2ML IJ SOLN
INTRAMUSCULAR | Status: DC | PRN
Start: 2020-04-12 — End: 2020-04-12
  Administered 2020-04-12: 15 ug via INTRATHECAL

## 2020-04-12 MED ORDER — MEPERIDINE HCL 25 MG/ML IJ SOLN: 6.2500 mg | INTRAMUSCULAR | Status: DC | PRN

## 2020-04-12 MED ORDER — POVIDONE-IODINE 10 % EX SWAB: 2.0000 "application " | Freq: Once | CUTANEOUS | Status: DC

## 2020-04-12 MED ORDER — TETANUS-DIPHTH-ACELL PERTUSSIS 5-2.5-18.5 LF-MCG/0.5 IM SUSP: 0.5000 mL | Freq: Once | INTRAMUSCULAR | Status: DC

## 2020-04-12 MED ORDER — HYDROMORPHONE HCL 1 MG/ML IJ SOLN: 0.2000 mg | INTRAMUSCULAR | Status: DC | PRN

## 2020-04-12 MED ORDER — FENTANYL CITRATE (PF) 100 MCG/2ML IJ SOLN
INTRAMUSCULAR | Status: AC
  Filled 2020-04-12: qty 2

## 2020-04-12 MED ORDER — IBUPROFEN 800 MG PO TABS
800.0000 mg | ORAL_TABLET | Freq: Four times a day (QID) | ORAL | Status: DC
  Administered 2020-04-13 – 2020-04-14 (×5): 800 mg via ORAL
  Filled 2020-04-12 (×5): qty 1

## 2020-04-12 MED ORDER — SCOPOLAMINE 1 MG/3DAYS TD PT72
MEDICATED_PATCH | TRANSDERMAL | Status: AC
  Filled 2020-04-12: qty 1

## 2020-04-12 MED ORDER — SENNOSIDES-DOCUSATE SODIUM 8.6-50 MG PO TABS
2.0000 | ORAL_TABLET | ORAL | Status: DC
  Administered 2020-04-12 – 2020-04-13 (×2): 2 via ORAL
  Filled 2020-04-12 (×2): qty 2

## 2020-04-12 MED ORDER — OXYTOCIN-SODIUM CHLORIDE 30-0.9 UT/500ML-% IV SOLN
INTRAVENOUS | Status: DC | PRN
  Administered 2020-04-12: 30 [IU] via INTRAVENOUS

## 2020-04-12 MED ORDER — KETOROLAC TROMETHAMINE 30 MG/ML IJ SOLN
INTRAMUSCULAR | Status: AC
  Filled 2020-04-12: qty 1

## 2020-04-12 MED ORDER — KETOROLAC TROMETHAMINE 30 MG/ML IJ SOLN
30.0000 mg | Freq: Four times a day (QID) | INTRAMUSCULAR | Status: AC
  Administered 2020-04-12 – 2020-04-13 (×3): 30 mg via INTRAVENOUS
  Filled 2020-04-12 (×3): qty 1

## 2020-04-12 MED ORDER — ACETAMINOPHEN 160 MG/5ML PO SOLN: 325.0000 mg | ORAL | Status: DC | PRN

## 2020-04-12 MED ORDER — ACETAMINOPHEN 325 MG PO TABS: 325.0000 mg | ORAL_TABLET | ORAL | Status: DC | PRN

## 2020-04-12 MED ORDER — MAGNESIUM HYDROXIDE 400 MG/5ML PO SUSP: 30.0000 mL | ORAL | Status: DC | PRN

## 2020-04-12 MED ORDER — SCOPOLAMINE 1 MG/3DAYS TD PT72
1.0000 | MEDICATED_PATCH | TRANSDERMAL | Status: DC
  Administered 2020-04-12: 1.5 mg via TRANSDERMAL

## 2020-04-12 MED ORDER — ONDANSETRON HCL 4 MG/2ML IJ SOLN
INTRAMUSCULAR | Status: AC
  Filled 2020-04-12: qty 2

## 2020-04-12 MED ORDER — PRENATAL MULTIVITAMIN CH
1.0000 | ORAL_TABLET | Freq: Every day | ORAL | Status: DC
  Administered 2020-04-13: 1 via ORAL
  Filled 2020-04-12 (×2): qty 1

## 2020-04-12 MED ORDER — LACTATED RINGERS IV SOLN: INTRAVENOUS | Status: DC

## 2020-04-12 MED ORDER — DIBUCAINE (PERIANAL) 1 % EX OINT: 1.0000 "application " | TOPICAL_OINTMENT | CUTANEOUS | Status: DC | PRN

## 2020-04-12 MED ORDER — COCONUT OIL OIL: 1.0000 "application " | TOPICAL_OIL | Status: DC | PRN

## 2020-04-12 MED ORDER — MENTHOL 3 MG MT LOZG: 1.0000 | LOZENGE | OROMUCOSAL | Status: DC | PRN

## 2020-04-12 MED ORDER — PHENYLEPHRINE HCL-NACL 20-0.9 MG/250ML-% IV SOLN
INTRAVENOUS | Status: AC
  Filled 2020-04-12: qty 250

## 2020-04-12 MED ORDER — CEFAZOLIN SODIUM-DEXTROSE 2-4 GM/100ML-% IV SOLN
INTRAVENOUS | Status: AC
  Filled 2020-04-12: qty 100

## 2020-04-12 MED ORDER — WITCH HAZEL-GLYCERIN EX PADS: 1.0000 "application " | MEDICATED_PAD | CUTANEOUS | Status: DC | PRN

## 2020-04-12 MED ORDER — SIMETHICONE 80 MG PO CHEW
80.0000 mg | CHEWABLE_TABLET | ORAL | Status: DC
  Administered 2020-04-12 – 2020-04-13 (×3): 80 mg via ORAL
  Filled 2020-04-12 (×2): qty 1

## 2020-04-12 MED ORDER — ONDANSETRON HCL 4 MG/2ML IJ SOLN: 4.0000 mg | Freq: Once | INTRAMUSCULAR | Status: DC | PRN

## 2020-04-12 MED ORDER — SIMETHICONE 80 MG PO CHEW: 80.0000 mg | CHEWABLE_TABLET | ORAL | Status: DC | PRN

## 2020-04-12 MED ORDER — OXYCODONE HCL 5 MG PO TABS
5.0000 mg | ORAL_TABLET | ORAL | Status: DC | PRN
  Administered 2020-04-13: 5 mg via ORAL
  Filled 2020-04-12: qty 1

## 2020-04-12 MED ORDER — DIPHENHYDRAMINE HCL 25 MG PO CAPS: 25.0000 mg | ORAL_CAPSULE | Freq: Four times a day (QID) | ORAL | Status: DC | PRN

## 2020-04-12 MED ORDER — ONDANSETRON HCL 4 MG/2ML IJ SOLN
INTRAMUSCULAR | Status: DC | PRN
  Administered 2020-04-12: 4 mg via INTRAVENOUS

## 2020-04-12 MED ORDER — FENTANYL CITRATE (PF) 100 MCG/2ML IJ SOLN
25.0000 ug | INTRAMUSCULAR | Status: DC | PRN
  Administered 2020-04-12: 50 ug via INTRAVENOUS

## 2020-04-12 MED ORDER — MORPHINE SULFATE (PF) 0.5 MG/ML IJ SOLN
INTRAMUSCULAR | Status: AC
  Filled 2020-04-12: qty 10

## 2020-04-12 MED ORDER — CEFAZOLIN SODIUM-DEXTROSE 2-3 GM-%(50ML) IV SOLR
INTRAVENOUS | Status: DC | PRN
  Administered 2020-04-12: 2 g via INTRAVENOUS

## 2020-04-12 MED ORDER — OXYTOCIN-SODIUM CHLORIDE 30-0.9 UT/500ML-% IV SOLN
INTRAVENOUS | Status: AC
  Filled 2020-04-12: qty 500

## 2020-04-12 MED ORDER — OXYTOCIN-SODIUM CHLORIDE 30-0.9 UT/500ML-% IV SOLN
2.5000 [IU]/h | INTRAVENOUS | Status: AC
  Administered 2020-04-13: 2.5 [IU]/h via INTRAVENOUS
  Filled 2020-04-12: qty 500

## 2020-04-12 MED ORDER — SODIUM CHLORIDE 0.9 % IR SOLN
Status: DC | PRN
  Administered 2020-04-12: 1

## 2020-04-12 MED ORDER — KETOROLAC TROMETHAMINE 30 MG/ML IJ SOLN
30.0000 mg | Freq: Once | INTRAMUSCULAR | Status: AC
  Administered 2020-04-12: 30 mg via INTRAVENOUS

## 2020-04-12 MED ORDER — OXYCODONE HCL 5 MG PO TABS: 5.0000 mg | ORAL_TABLET | Freq: Once | ORAL | Status: DC | PRN

## 2020-04-12 MED ORDER — ACETAMINOPHEN 500 MG PO TABS
1000.0000 mg | ORAL_TABLET | Freq: Four times a day (QID) | ORAL | Status: DC
  Administered 2020-04-12 – 2020-04-14 (×7): 1000 mg via ORAL
  Filled 2020-04-12 (×7): qty 2

## 2020-04-12 MED ORDER — MORPHINE SULFATE (PF) 0.5 MG/ML IJ SOLN
INTRAMUSCULAR | Status: DC | PRN
Start: 2020-04-12 — End: 2020-04-12
  Administered 2020-04-12: .15 mg via INTRATHECAL

## 2020-04-12 MED ORDER — ENOXAPARIN SODIUM 60 MG/0.6ML ~~LOC~~ SOLN
0.5000 mg/kg | SUBCUTANEOUS | Status: DC
  Administered 2020-04-13 – 2020-04-14 (×2): 45 mg via SUBCUTANEOUS
  Filled 2020-04-12 (×2): qty 0.6

## 2020-04-12 MED ORDER — OXYCODONE HCL 5 MG/5ML PO SOLN: 5.0000 mg | Freq: Once | ORAL | Status: DC | PRN

## 2020-04-12 MED ORDER — PHENYLEPHRINE HCL-NACL 20-0.9 MG/250ML-% IV SOLN
INTRAVENOUS | Status: DC | PRN
  Administered 2020-04-12: 60 ug/min via INTRAVENOUS

## 2020-04-12 SURGICAL SUPPLY — 40 items
ADH SKN CLS APL DERMABOND .7 (GAUZE/BANDAGES/DRESSINGS) ×1
CHLORAPREP W/TINT 26ML (MISCELLANEOUS) ×3 IMPLANT
CLAMP CORD UMBIL (MISCELLANEOUS) IMPLANT
CLOTH BEACON ORANGE TIMEOUT ST (SAFETY) ×3 IMPLANT
DERMABOND ADVANCED (GAUZE/BANDAGES/DRESSINGS) ×2
DERMABOND ADVANCED .7 DNX12 (GAUZE/BANDAGES/DRESSINGS) ×2 IMPLANT
DRSG OPSITE POSTOP 4X10 (GAUZE/BANDAGES/DRESSINGS) ×3 IMPLANT
ELECT REM PT RETURN 9FT ADLT (ELECTROSURGICAL) ×3
ELECTRODE REM PT RTRN 9FT ADLT (ELECTROSURGICAL) ×1 IMPLANT
EXTRACTOR VACUUM BELL STYLE (SUCTIONS) IMPLANT
GLOVE BIOGEL PI IND STRL 7.0 (GLOVE) ×1 IMPLANT
GLOVE BIOGEL PI IND STRL 8 (GLOVE) ×1 IMPLANT
GLOVE BIOGEL PI INDICATOR 7.0 (GLOVE) ×2
GLOVE BIOGEL PI INDICATOR 8 (GLOVE) ×2
GLOVE ECLIPSE 8.0 STRL XLNG CF (GLOVE) ×3 IMPLANT
GOWN STRL REUS W/TWL LRG LVL3 (GOWN DISPOSABLE) ×6 IMPLANT
KIT ABG SYR 3ML LUER SLIP (SYRINGE) ×3 IMPLANT
NDL HYPO 18GX1.5 BLUNT FILL (NEEDLE) ×1 IMPLANT
NDL HYPO 25X5/8 SAFETYGLIDE (NEEDLE) ×1 IMPLANT
NEEDLE HYPO 18GX1.5 BLUNT FILL (NEEDLE) ×3 IMPLANT
NEEDLE HYPO 22GX1.5 SAFETY (NEEDLE) ×3 IMPLANT
NEEDLE HYPO 25X5/8 SAFETYGLIDE (NEEDLE) ×3 IMPLANT
NS IRRIG 1000ML POUR BTL (IV SOLUTION) ×3 IMPLANT
PACK C SECTION WH (CUSTOM PROCEDURE TRAY) ×3 IMPLANT
PAD OB MATERNITY 4.3X12.25 (PERSONAL CARE ITEMS) ×3 IMPLANT
PENCIL SMOKE EVAC W/HOLSTER (ELECTROSURGICAL) ×3 IMPLANT
RTRCTR C-SECT PINK 25CM LRG (MISCELLANEOUS) IMPLANT
SUT CHROMIC 0 CT 1 (SUTURE) ×3 IMPLANT
SUT MNCRL 0 VIOLET CTX 36 (SUTURE) ×2 IMPLANT
SUT MONOCRYL 0 CTX 36 (SUTURE) ×6
SUT PLAIN 2 0 (SUTURE)
SUT PLAIN 2 0 XLH (SUTURE) IMPLANT
SUT PLAIN ABS 2-0 CT1 27XMFL (SUTURE) IMPLANT
SUT VIC AB 0 CTX 36 (SUTURE) ×3
SUT VIC AB 0 CTX36XBRD ANBCTRL (SUTURE) ×1 IMPLANT
SUT VIC AB 4-0 KS 27 (SUTURE) IMPLANT
SYR 20CC LL (SYRINGE) ×6 IMPLANT
TOWEL OR 17X24 6PK STRL BLUE (TOWEL DISPOSABLE) ×3 IMPLANT
TRAY FOLEY W/BAG SLVR 14FR LF (SET/KITS/TRAYS/PACK) IMPLANT
WATER STERILE IRR 1000ML POUR (IV SOLUTION) ×3 IMPLANT

## 2020-04-12 NOTE — Anesthesia Preprocedure Evaluation (Addendum)
Anesthesia Evaluation  Patient identified by MRN, date of birth, ID band Patient awake    Reviewed: Allergy & Precautions, H&P , NPO status , Patient's Chart, lab work & pertinent test results, reviewed documented beta blocker date and time   Airway Mallampati: I  TM Distance: >3 FB Neck ROM: full    Dental no notable dental hx. (+) Teeth Intact, Dental Advisory Given   Pulmonary neg pulmonary ROS, Current Smoker,    Pulmonary exam normal breath sounds clear to auscultation       Cardiovascular negative cardio ROS Normal cardiovascular exam Rhythm:regular Rate:Normal     Neuro/Psych PSYCHIATRIC DISORDERS Anxiety Depression negative neurological ROS     GI/Hepatic negative GI ROS, Neg liver ROS,   Endo/Other  diabetesMorbid obesity  Renal/GU negative Renal ROS  negative genitourinary   Musculoskeletal   Abdominal   Peds  Hematology  (+) Blood dyscrasia, Sickle cell trait and anemia , Alpha thalassemia silent carrier   Anesthesia Other Findings   Reproductive/Obstetrics (+) Pregnancy                            Anesthesia Physical Anesthesia Plan  ASA: III  Anesthesia Plan: Spinal   Post-op Pain Management:    Induction:   PONV Risk Score and Plan: 1 and Scopolamine patch - Pre-op  Airway Management Planned: Simple Face Mask  Additional Equipment:   Intra-op Plan:   Post-operative Plan:   Informed Consent: I have reviewed the patients History and Physical, chart, labs and discussed the procedure including the risks, benefits and alternatives for the proposed anesthesia with the patient or authorized representative who has indicated his/her understanding and acceptance.       Plan Discussed with: Anesthesiologist and CRNA  Anesthesia Plan Comments:         Anesthesia Quick Evaluation

## 2020-04-12 NOTE — Transfer of Care (Signed)
Immediate Anesthesia Transfer of Care Note  Patient: Meredith Jackson  Procedure(s) Performed: CESAREAN SECTION (N/A )  Patient Location: PACU  Anesthesia Type:Spinal  Level of Consciousness: awake, alert  and oriented  Airway & Oxygen Therapy: Patient Spontanous Breathing  Post-op Assessment: Report given to RN and Post -op Vital signs reviewed and stable  Post vital signs: Reviewed and stable  Last Vitals:  Vitals Value Taken Time  BP 112/66 04/12/20 1700  Temp 36.4 C 04/12/20 1700  Pulse 68 04/12/20 1700  Resp 18 04/12/20 1700  SpO2 100 % 04/12/20 1700    Last Pain:  Vitals:   04/12/20 1700  TempSrc: Oral  PainSc: 0-No pain         Complications: No complications documented.

## 2020-04-12 NOTE — Op Note (Signed)
Cesarean Section Procedure Note   Meredith Jackson  04/12/2020  Indications: prior classical cesarean   Pre-operative Diagnosis: RCS Previous Classical.   Post-operative Diagnosis: Same   Surgeon: Surgeon(s) and Role:    * Florian Buff, MD - Primary    * Arrie Senate, MD   Anesthesia: spinal    Estimated Blood Loss: 277 mL   Total IV Fluids: 2500 ml   Urine Output: 100CC OF clear urine  Specimens: placenta to L&D  Baby condition / location:  Couplet care / Skin to Skin , weight and APGARS pending  Complications: no complications  Indications: Meredith Jackson is a 34 y.o. F7P1025 with an IUP [redacted]w[redacted]d presenting for scheduled rLTCS after prior classical cesarean.  The risks, benefits, complications, treatment options, and expected outcomes were discussed with the patient . The patient concurred with the proposed plan, giving informed consent. identified as Meredith Jackson and the procedure verified as C-Section Delivery.  Procedure Details: A Time Out was held and the above information confirmed.  The patient was taken back to the operative suite where spinal anesthesia was placed.  After induction of anesthesia, the patient was draped and prepped in the usual sterile manner and placed in a dorsal supine position with a leftward tilt. A transverse was made and carried down through the subcutaneous tissue to the fascia. Fascial incision was made and extended transversely. The fascia was separated from the underlying rectus tissue superiorly and inferiorly. The peritoneum was identified and entered. Peritoneal incision was extended longitudinally. A low transverse uterine incision was made. Delivered from cephalic presentation was live female newborn. Cord ph was not sent the umbilical cord was clamped and cut cord blood was obtained for evaluation. The placenta was removed Intact and appeared normal. The uterus was exteriorized.The uterine outline, tubes and ovaries appeared normal. The  uterine incision was closed with running locked sutures of 0-monocryl. An imbricating layer with 0-vicryl was then completed. Hemostasis was observed. Lavage was carried out until clear. The fascia was then reapproximated with running sutures of 3-0chromic gut. The skin was closed with 4-0Vicryl.   Instrument, sponge, and needle counts were correct prior the abdominal closure and were correct at the conclusion of the case.     Disposition: PACU - hemodynamically stable.   Maternal Condition: stable       Signed: Naaman Plummer 8/52/7782 10:49 AM

## 2020-04-12 NOTE — Discharge Summary (Signed)
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 yo [id number] admitted to the hospital 04/12/2020 for scheduled cesarean section with the following indication:prior classical cesarean.Delivery details are as follows:  Membrane Rupture Time/Date: 10:08 AM ,04/12/2020   Delivery Method:C-Section, Low Transverse  Details of operation can be found in separate operative note.  Patient had an uncomplicated postpartum course.  She is ambulating, tolerating a regular diet, passing flatus, and urinating well. Patient is discharged home in stable condition on  04/14/20        Newborn Data: Birth date:04/12/2020  Birth time:10:09 AM  Gender:Female  Living status:Living  Apgars:8 ,10  Weight:3016 g     Magnesium Sulfate received: No BMZ received: No Rhophylac:N/A MMR:N/A T-DaP:Given prenatally Flu:  N/A Transfusion:No  Physical exam  Vitals:   04/13/20 0200 04/13/20 0540 04/13/20 1607 04/13/20 2042  BP: (!) 104/59 (!) 99/59 126/72 124/79  Pulse: 67 (!) 56 87 68  Resp: 18 18 19 18   Temp: 98 F (36.7 C) 97.9 F (36.6 C) 98.2 F (36.8 C) 98.6 F (37 C)  TempSrc: Oral Oral Oral Oral  SpO2: 100% 100% 98% 100%  Weight:      Height:       General: alert, cooperative and no distress Lochia: appropriate Uterine Fundus: firm Incision: honeycomb dressing c/d/i DVT Evaluation: No evidence of DVT seen on physical exam. Labs: Lab Results  Component Value Date   WBC 7.1 04/13/2020   HGB 9.4 (L) 04/13/2020   HCT 29.9 (L) 04/13/2020   MCV 85.4 04/13/2020   PLT 171 04/13/2020   CMP Latest Ref Rng & Units 09/06/2019  Glucose 70 - 99 mg/dL 119(H)  BUN 6 - 20 mg/dL 12  Creatinine 0.44 - 1.00 mg/dL 0.74  Sodium 135 - 145 mmol/L 131(L)  Potassium 3.5 - 5.1 mmol/L 3.6  Chloride 98 - 111 mmol/L 102  CO2 22 - 32 mmol/L 20(L)  Calcium 8.9 - 10.3 mg/dL 9.3  Total Protein 6.5 - 8.1 g/dL 6.6  Total Bilirubin 0.3 - 1.2 mg/dL <0.1(L)  Alkaline Phos 38 - 126 U/L 51  AST 15 - 41 U/L 32  ALT 0 - 44 U/L 29   Edinburgh Score: No flowsheet data found.   After visit meds:  Allergies  as of 04/14/2020   No Known Allergies     Medication List    STOP taking these medications   Accu-Chek Guide test strip Generic drug: glucose blood   Accu-Chek Guide w/Device Kit   Accu-Chek Softclix Lancets lancets   aspirin EC 81 MG tablet   Blood Pressure Kit Chemical engineer Maternity Supp Lg Misc   Comfort Fit Maternity Supp Sm Misc   pantoprazole 20 MG tablet Commonly known as: Protonix   valACYclovir 500 MG tablet Commonly known as: Valtrex     TAKE these medications   acetaminophen 500 MG tablet Commonly known as: TYLENOL Take 2 tablets (1,000 mg total) by mouth every 6 (six) hours.   ascorbic acid 250 MG tablet Commonly known as: VITAMIN C Take 1 tablet (250 mg total) by  mouth every other day. Start taking on: April 15, 2020   ferrous gluconate 324 MG tablet Commonly known as: FERGON Take 1 tablet (324 mg total) by mouth every other day. Start taking on: April 15, 2020   ibuprofen 800 MG tablet Commonly known as: ADVIL Take 1 tablet (800 mg total) by mouth every 6 (six) hours.   prenatal vitamin w/FE, FA 27-1 MG Tabs tablet Take 1 tablet by mouth daily at 12 noon.        Discharge home in stable condition Infant Feeding: Breast Infant Disposition:home with mother Discharge instruction: per After Visit Summary and Postpartum booklet. Activity: Advance as tolerated. Pelvic rest for 6 weeks.  Diet: routine diet Future Appointments: Future Appointments  Date Time Provider [place]  04/22/2020 10:40 AM Esmeralda None  05/25/2020  8:45 AM [facility] None  05/25/2020  9:00 AM Constant, Peggy, MD Ethel None  06/02/2020  9:30 AM Monico Hoar, PT [facility]  06/09/2020 11:30 AM Monico Hoar, PT [facility]  06/16/2020 10:30 AM Monico Hoar, PT [facility]  06/23/2020 10:30 AM Monico Hoar, PT [facility]   Follow up Visit:   Please schedule this patient for a In person postpartum visit in 4 weeks with the following provider: Any provider. Additional Postpartum F/U:2 hour GTT and Incision check 1 week  High risk pregnancy complicated by: GDM Delivery mode:  C-Section, Low Transverse  Anticipated Birth Control:  Nexplanon   1/63/[id number] Arrie Senate, MD

## 2020-04-12 NOTE — H&P (Signed)
OBSTETRIC ADMISSION HISTORY AND PHYSICAL  Meredith Jackson is a 34 y.o. female [phone] with IUP at 53w0dby 7wk u/s presenting for rLTCS (prior classical). She reports +FMs, No LOF, no VB, no blurry vision, headaches or peripheral edema, and RUQ pain.  She plans on breast and bottle feeding. She request nexplanon for birth control. [facility] --->  Estimated Date of Delivery: 05/03/20  Sono:    03/30/20'@[redacted]w[redacted]d' , CWD, normal anatomy, cephalic presentation, 25701X 60% EFW   Prenatal History/Complications:  [id number] (on suppression) Hx of IUFD (38wks) Hx of prior classical c/s (G4)   Past Medical History: Past Medical History:  Diagnosis Date   Anxiety    Depression    Encounter for Nexplanon removal 09/12/2017   Gestational diabetes     Past Surgical History: Past Surgical History:  Procedure Laterality Date   CESAREAN SECTION     x2    Obstetrical History: OB History    Gravida  6   Para  3   Term  1   Preterm  2   AB  2   Living  2     SAB  2   TAB      Ectopic      Multiple      Live Births  2           Social History Social History   Socioeconomic History   Marital status: Single    Spouse name: Not on file   Number of children: Not on file   Years of education: Not on file   Highest education level: Not on file  Occupational History   Not on file  Tobacco Use   Smoking status: Current Every Day Smoker    Packs/day: 0.25    Types: Cigarettes   Smokeless tobacco: Never Used  Vaping Use   Vaping Use: Never used  Substance and Sexual Activity   Alcohol use: Not Currently    Comment: Occasional   Drug use: No   Sexual activity: Yes    Partners: Male  Other Topics Concern   Not on file  Social History Narrative   Not on file   Social Determinants of Health   Financial Resource Strain:    Difficulty of Paying Living Expenses: Not on file  Food Insecurity:     Worried About Running Out of Food in the Last Year: Not on file   RYR[employer] Worldwideof Food in the Last Year: Not on file  Transportation Needs:    Lack of Transportation (Medical): Not on file   Lack of Transportation (Non-Medical): Not on file  Physical Activity:    Days of Exercise per Week: Not on file   Minutes of Exercise per Session: Not on file  Stress:    Feeling of Stress : Not on file  Social Connections:    Frequency of Communication with Friends and Family: Not on file   Frequency of Social Gatherings with Friends and Family: Not on file   Attends Religious Services: Not on file   Active Member of Clubs or Organizations: Not on file   Attends CArchivistMeetings: Not on file   Marital Status: Not on file    Family History: Family History  Problem Relation Age of Onset   Hyperthyroidism Mother    Diabetes Mother     Allergies: No Known Allergies  Medications Prior to Admission  Medication Sig Dispense Refill Last Dose  aspirin EC 81 MG tablet Take 1 tablet (81 mg total) by mouth daily. Take after 12 weeks for prevention of preeclampsia later in pregnancy 100 tablet 2    pantoprazole (PROTONIX) 20 MG tablet Take 1 tablet (20 mg total) by mouth daily. 30 tablet 3    prenatal vitamin w/FE, FA (PRENATAL 1 + 1) 27-1 MG TABS tablet Take 1 tablet by mouth daily at 12 noon. 30 tablet 11    valACYclovir (VALTREX) 500 MG tablet Take 1 tablet (500 mg total) by mouth 2 (two) times daily. 60 tablet 0    Accu-Chek Softclix Lancets lancets 1 each by Other route 4 (four) times daily. 100 each 12    Blood Glucose Monitoring Suppl (ACCU-CHEK GUIDE) w/Device KIT 1 Device by Does not apply route 4 (four) times daily. 1 kit 0    Blood Pressure Monitoring (BLOOD PRESSURE KIT) DEVI 1 kit by Does not apply route as needed. 1 each 0    Elastic Bandages & Supports (COMFORT FIT MATERNITY SUPP LG) MISC Wear daily when ambulating 1 each 0    Elastic Bandages & Supports  (COMFORT FIT MATERNITY SUPP SM) MISC 1 Units by Does not apply route daily as needed. 1 each 0    glucose blood (ACCU-CHEK GUIDE) test strip Use to check blood sugars four times a day was instructed 50 each 12      Review of Systems   All systems reviewed and negative except as stated in HPI  Blood pressure 133/78, pulse 90, temperature 98.6 F (37 C), temperature source Oral, resp. rate 18, height '5\' 3"'  (1.6 m), weight 90 kg, last menstrual period 06/30/2019. General appearance: alert, cooperative and no distress Lungs: normal effort Heart: regular rate and rhythm Abdomen: soft, gravid Extremities: no sign of DVT Presentation: cephalic  Prenatal labs: ABO, Rh: --/--/O POS (08/27 4970) Antibody: NEG (08/27 0838) Rubella: 1.73 (03/03 1421) RPR: NON REACTIVE (08/27 0843)  HBsAg: Negative (03/03 1421)  HIV: Non Reactive (07/08 1054)  GBS: Negative/-- (08/18 1112)  2 hr Glucola failed (88/187/77) Genetic screening  Low risk Anatomy US normal  Prenatal Transfer Tool  Maternal Diabetes: Yes:  Diabetes Type:  Diet controlled Genetic Screening: Normal Maternal Ultrasounds/Referrals: Normal Fetal Ultrasounds or other Referrals:  None Maternal Substance Abuse:  No Significant Maternal Medications:  None Significant Maternal Lab Results: None  Results for orders placed or performed during the hospital encounter of 04/12/20 (from the past 24 hour(s))  Glucose, capillary   Collection Time: 04/12/20  8:33 AM  Result Value Ref Range   Glucose-Capillary 97 70 - 99 mg/dL    Patient Active Problem List   Diagnosis Date Noted   History of preterm delivery 03/26/2020   History of prior pregnancy with IUGR newborn 03/26/2020   Gestational diabetes 02/25/2020   Fetal cardiac echogenic focus, antepartum 12/11/2019   Herpes infection during pregnancy, antepartum 11/16/2019   MCAD (medium-chain acyl-CoA dehydrogenase deficiency) ([place]utz) 10/31/2019   Alpha thalassemia silent carrier  10/31/2019   History of classical cesarean section 10/16/2019   History of IUFD 10/16/2019   Maternal obesity affecting pregnancy, antepartum 10/16/2019   Supervision of other high risk pregnancy, antepartum 09/20/2019    Assessment/Plan:  Meredith Jackson is a 34 y.o. [id number] at 25w0dhere for rLTCS (prior classical).  #Schedule LTCS (prior classical)  The risks of cesarean section were discussed with the patient including but were not limited to: bleeding which may require transfusion or reoperation; infection which may require antibiotics; injury to bowel, bladder,  ureters or other surrounding organs; injury to the fetus; need for additional procedures including hysterectomy in the event of a life-threatening hemorrhage; placental abnormalities wth subsequent pregnancies, incisional problems, thromboembolic phenomenon and other postoperative/anesthesia complications. The patient concurred with the proposed plan, giving informed written consent for the procedures.  Patient has been NPO since 0000 she will remain NPO for procedure. Anesthesia and OR aware.  Preoperative prophylactic antibiotics and SCDs ordered on call to the OR.  To OR when ready.  #Pain: spinal #FWB: FHT reassuring #ID: GBS neg #MOF: both #MOC: nexplanon #Circ: girl  Arrie Senate, MD  04/12/2020, 8:59 AM

## 2020-04-12 NOTE — Lactation Note (Addendum)
This note was copied from a baby's chart. Lactation Consultation Note   Infant is 6 hours old, 37 weeks. Mom states she has been nursing and offering bottles. Her plan is to pump and offer both breast and bottle.   Mom last fed at the breast 12:40 pm for 20 minutes. She is also supplementing with formula with Similac between 11 and 12 ml.   Mom was in pain and the nurse came in to assist and due vitals on infant. Once Mom felt better, we were able to continue.   LC examined Mom's nipples and noted a compression on the right breast but she states she has no pain. Mom's nipples are flat but will erect with stimulation. LC showed Mom how to do breast massage and hand express.   LC assisted Mom latching infant on the left breast for 13 minutes. LC instructed Mom to paced bottle feed with infant sitting up. Mom had a C section and in some pain. She reclined the bottle and gave 33 ml of Similac.  Mom given coconut oil by RN for nipple care.   Plan 1. Mom will nurse infant based on feeding cues 8-12 in 24 hour period. Not to go longer than 4 hours without a feed. Mom will then pump using DEBP for stimulation.           2. Mom given breastfeeding supplementation guidelines and will supplement with formula until her milk comes in. Mom knows to feed based on cues as long as infant showing signs she is hungry.            3. Mom does not have a pump at home and has [place].           4. WIC referral sent.             37. Mom knows to call the RN or LC to assist with latch before the next feed.      Neelie Welshans  Nicholson-Springer 04/12/2020, 4:55 PM

## 2020-04-13 DIAGNOSIS — D62 Acute posthemorrhagic anemia: Secondary | ICD-10-CM | POA: Diagnosis not present

## 2020-04-13 LAB — CBC
HCT: 29.9 % — ABNORMAL LOW (ref 36.0–46.0)
Hemoglobin: 9.4 g/dL — ABNORMAL LOW (ref 12.0–15.0)
MCH: 26.9 pg (ref 26.0–34.0)
MCHC: 31.4 g/dL (ref 30.0–36.0)
MCV: 85.4 fL (ref 80.0–100.0)
Platelets: 171 10*3/uL (ref 150–400)
RBC: 3.5 MIL/uL — ABNORMAL LOW (ref 3.87–5.11)
RDW: 13.4 % (ref 11.5–15.5)
WBC: 7.1 10*3/uL (ref 4.0–10.5)
nRBC: 0 % (ref 0.0–0.2)

## 2020-04-13 LAB — BIRTH TISSUE RECOVERY COLLECTION (PLACENTA DONATION)

## 2020-04-13 MED ORDER — VITAMIN C 250 MG PO TABS
250.0000 mg | ORAL_TABLET | ORAL | Status: DC
  Administered 2020-04-13: 250 mg via ORAL
  Filled 2020-04-13: qty 1

## 2020-04-13 MED ORDER — FERROUS GLUCONATE 324 (38 FE) MG PO TABS
324.0000 mg | ORAL_TABLET | ORAL | Status: DC
  Administered 2020-04-13: 324 mg via ORAL
  Filled 2020-04-13: qty 1

## 2020-04-13 MED ORDER — LIDOCAINE HCL 1 % IJ SOLN
0.0000 mL | Freq: Once | INTRAMUSCULAR | Status: DC | PRN
  Filled 2020-04-13: qty 20

## 2020-04-13 MED ORDER — ETONOGESTREL 68 MG ~~LOC~~ IMPL
68.0000 mg | DRUG_IMPLANT | Freq: Once | SUBCUTANEOUS | Status: DC
  Administered 2020-04-14: 68 mg via SUBCUTANEOUS
  Filled 2020-04-13: qty 1

## 2020-04-13 NOTE — Progress Notes (Signed)
Subjective: POD# 1: Cesarean Delivery  Patient is doing well without complaints. Ambulating without difficulty. Voiding. Has not passed flatus or had BM. Tolerating PO. Abdominal pain improved. Vaginal bleeding decreased.  Objective: Vital signs in last 24 hours: Temp:  [97.5 F (36.4 C)-98.6 F (37 C)] 97.9 F (36.6 C) (08/30 0540) Pulse Rate:  [56-90] 56 (08/30 0540) Resp:  [15-23] 18 (08/30 0540) BP: (66-133)/(55-91) 99/59 (08/30 0540) SpO2:  [98 %-100 %] 100 % (08/30 0540) Weight:  [90 kg] 90 kg (08/29 0850)  Physical Exam:  General: alert, cooperative and no distress Lochia: appropriate Uterine Fundus: firm Incision: honeycomb dressing c/d/i DVT Evaluation: No evidence of DVT seen on physical exam.  Recent Labs    04/12/20 0840 04/13/20 0443  HGB 11.3* 9.4*  HCT 35.9* 29.9*    Assessment/Plan: POD#1 rLTCS with history of prior classical  -Doing well without complaints  -Needs to pass flatus  -Needs nexplanon  -breast and bottle feeding  #A1GDM  -PP FBGL 97  -2hr GTT at 4 week appt  Plan for discharge tomorrow.  Arrie Senate 3/74/8270, 6:05 AM

## 2020-04-13 NOTE — Anesthesia Postprocedure Evaluation (Signed)
Anesthesia Post Note  Patient: Meredith Jackson  Procedure(s) Performed: CESAREAN SECTION (N/A )     Anesthesia Type: Spinal   No complications documented.  Last Vitals:  Vitals:   04/13/20 0200 04/13/20 0540  BP: (!) 104/59 (!) 99/59  Pulse: 67 (!) 56  Resp: 18 18  Temp: 36.7 C 36.6 C  SpO2: 100% 100%    Last Pain:  Vitals:   04/13/20 0800  TempSrc:   PainSc: 0-No pain                 Aubra Pappalardo

## 2020-04-14 ENCOUNTER — Encounter (HOSPITAL_COMMUNITY): Payer: Self-pay | Admitting: Anesthesiology

## 2020-04-14 MED ORDER — ACETAMINOPHEN 500 MG PO TABS
1000.0000 mg | ORAL_TABLET | Freq: Four times a day (QID) | ORAL | 0 refills | Status: DC
Start: 2020-04-14 — End: 2023-08-03

## 2020-04-14 MED ORDER — FERROUS GLUCONATE 324 (38 FE) MG PO TABS
324.0000 mg | ORAL_TABLET | ORAL | 0 refills | Status: DC
Start: 2020-04-15 — End: 2023-08-03

## 2020-04-14 MED ORDER — ETONOGESTREL 68 MG ~~LOC~~ IMPL: 68.0000 mg | DRUG_IMPLANT | Freq: Once | SUBCUTANEOUS | Status: DC

## 2020-04-14 MED ORDER — ASCORBIC ACID 250 MG PO TABS
250.0000 mg | ORAL_TABLET | ORAL | 0 refills | Status: DC
Start: 2020-04-15 — End: 2023-08-03

## 2020-04-14 MED ORDER — BUPIVACAINE IN DEXTROSE 0.75-8.25 % IT SOLN
INTRATHECAL | Status: DC | PRN
  Administered 2020-04-12: 1.6 mL via INTRATHECAL

## 2020-04-14 MED ORDER — LIDOCAINE HCL 1 % IJ SOLN: 0.0000 mL | Freq: Once | INTRAMUSCULAR | Status: DC | PRN

## 2020-04-14 MED ORDER — IBUPROFEN 800 MG PO TABS
800.0000 mg | ORAL_TABLET | Freq: Four times a day (QID) | ORAL | 0 refills | Status: DC
Start: 2020-04-14 — End: 2023-08-03

## 2020-04-14 NOTE — Addendum Note (Signed)
Addendum  created 04/14/20 0844 by Janeece Riggers, MD   Child order released for a procedure order, Clinical Note Signed, Intraprocedure Blocks edited

## 2020-04-14 NOTE — Anesthesia Procedure Notes (Signed)
Spinal  Patient location during procedure: OR Start time: 04/12/2020 9:35 AM End time: 04/12/2020 9:40 AM Staffing Anesthesiologist: Janeece Riggers, MD Preanesthetic Checklist Completed: patient identified, IV checked, site marked, risks and benefits discussed, surgical consent, monitors and equipment checked, pre-op evaluation and timeout performed Spinal Block Patient position: sitting Prep: DuraPrep Patient monitoring: heart rate, cardiac monitor, continuous pulse ox and blood pressure Approach: midline Location: L4-5 Injection technique: single-shot Needle Needle type: Sprotte  Needle gauge: 24 G Needle length: 9 cm Assessment Sensory level: T4

## 2020-04-14 NOTE — Discharge Instructions (Signed)
-tylenol 1000 mg every 6-8 hours alternating with ibuprofen 800 mg every 6 hours for pain -ferrous gluconate (iron) and vitamin c every other day (take together) -bowel regimen (miralax)  Postpartum Care After Cesarean Delivery This sheet gives you information about how to care for yourself from the time you deliver your baby to up to 6-12 weeks after delivery (postpartum period). Your health care provider may also give you more specific instructions. If you have problems or questions, contact your health care provider. Follow these instructions at home: Medicines  Take over-the-counter and prescription medicines only as told by your health care provider.  If you were prescribed an antibiotic medicine, take it as told by your health care provider. Do not stop taking the antibiotic even if you start to feel better.  Ask your health care provider if the medicine prescribed to you: ? Requires you to avoid driving or using heavy machinery. ? Can cause constipation. You may need to take actions to prevent or treat constipation, such as:  Drink enough fluid to keep your urine pale yellow.  Take over-the-counter or prescription medicines.  Eat foods that are high in fiber, such as beans, whole grains, and fresh fruits and vegetables.  Limit foods that are high in fat and processed sugars, such as fried or sweet foods. Activity  Gradually return to your normal activities as told by your health care provider.  Avoid activities that take a lot of effort and energy (are strenuous) until approved by your health care provider. Walking at a slow to moderate pace is usually safe. Ask your health care provider what activities are safe for you. ? Do not lift anything that is heavier than your baby or 10 lb (4.5 kg) as told by your health care provider. ? Do not vacuum, climb stairs, or drive a car for as long as told by your health care provider.  If possible, have someone help you at home until you  are able to do your usual activities yourself.  Rest as much as possible. Try to rest or take naps while your baby is sleeping. Vaginal bleeding  It is normal to have vaginal bleeding (lochia) after delivery. Wear a sanitary pad to absorb vaginal bleeding and discharge. ? During the first week after delivery, the amount and appearance of lochia is often similar to a menstrual period. ? Over the next few weeks, it will gradually decrease to a dry, yellow-brown discharge. ? For most women, lochia stops completely by 4-6 weeks after delivery. Vaginal bleeding can vary from woman to woman.  Change your sanitary pads frequently. Watch for any changes in your flow, such as: ? A sudden increase in volume. ? A change in color. ? Large blood clots.  If you pass a blood clot, save it and call your health care provider to discuss. Do not flush blood clots down the toilet before you get instructions from your health care provider.  Do not use tampons or douches until your health care provider says this is safe.  If you are not breastfeeding, your period should return 6-8 weeks after delivery. If you are breastfeeding, your period may return anytime between 8 weeks after delivery and the time that you stop breastfeeding. Perineal care   If your C-section (Cesarean section) was unplanned, and you were allowed to labor and push before delivery, you may have pain, swelling, and discomfort of the tissue between your vaginal opening and your anus (perineum). You may also have an incision in  the tissue (episiotomy) or the tissue may have torn during delivery. Follow these instructions as told by your health care provider: ? Keep your perineum clean and dry as told by your health care provider. Use medicated pads and pain-relieving sprays and creams as directed. ? If you have an episiotomy or vaginal tear, check the area every day for signs of infection. Check for:  Redness, swelling, or pain.  Fluid or  blood.  Warmth.  Pus or a bad smell. ? You may be given a squirt bottle to use instead of wiping to clean the perineum area after you go to the bathroom. As you start healing, you may use the squirt bottle before wiping yourself. Make sure to wipe gently. ? To relieve pain caused by an episiotomy, vaginal tear, or hemorrhoids, try taking a warm sitz bath 2-3 times a day. A sitz bath is a warm water bath that is taken while you are sitting down. The water should only come up to your hips and should cover your buttocks. Breast care  Within the first few days after delivery, your breasts may feel heavy, full, and uncomfortable (breast engorgement). You may also have milk leaking from your breasts. Your health care provider can suggest ways to help relieve breast discomfort. Breast engorgement should go away within a few days.  If you are breastfeeding: ? Wear a bra that supports your breasts and fits you well. ? Keep your nipples clean and dry. Apply creams and ointments as told by your health care provider. ? You may need to use breast pads to absorb milk leakage. ? You may have uterine contractions every time you breastfeed for several weeks after delivery. Uterine contractions help your uterus return to its normal size. ? If you have any problems with breastfeeding, work with your health care provider or a Science writer.  If you are not breastfeeding: ? Avoid touching your breasts as this can make your breasts produce more milk. ? Wear a well-fitting bra and use cold packs to help with swelling. ? Do not squeeze out (express) milk. This causes you to make more milk. Intimacy and sexuality  Ask your health care provider when you can engage in sexual activity. This may depend on your: ? Risk of infection. ? Healing rate. ? Comfort and desire to engage in sexual activity.  You are able to get pregnant after delivery, even if you have not had your period. If desired, talk with your  health care provider about methods of family planning or birth control (contraception). Lifestyle  Do not use any products that contain nicotine or tobacco, such as cigarettes, e-cigarettes, and chewing tobacco. If you need help quitting, ask your health care provider.  Do not drink alcohol, especially if you are breastfeeding. Eating and drinking   Drink enough fluid to keep your urine pale yellow.  Eat high-fiber foods every day. These may help prevent or relieve constipation. High-fiber foods include: ? Whole grain cereals and breads. ? Brown rice. ? Beans. ? Fresh fruits and vegetables.  Take your prenatal vitamins until your postpartum checkup or until your health care provider tells you it is okay to stop. General instructions  Keep all follow-up visits for you and your baby as told by your health care provider. Most women visit their health care provider for a postpartum checkup within the first 3-6 weeks after delivery. Contact a health care provider if you:  Feel unable to cope with the changes that a new baby brings  to your life, and these feelings do not go away.  Feel unusually sad or worried.  Have breasts that are painful, hard, or turn red.  Have a fever.  Have trouble holding urine or keeping urine from leaking.  Have little or no interest in activities you used to enjoy.  Have not breastfed at all and you have not had a menstrual period for 12 weeks after delivery.  Have stopped breastfeeding and you have not had a menstrual period for 12 weeks after you stopped breastfeeding.  Have questions about caring for yourself or your baby.  Pass a blood clot from your vagina. Get help right away if you:  Have chest pain.  Have difficulty breathing.  Have sudden, severe leg pain.  Have severe pain or cramping in your abdomen.  Bleed from your vagina so much that you fill more than one sanitary pad in one hour. Bleeding should not be heavier than your  heaviest period.  Develop a severe headache.  Faint.  Have blurred vision or spots in your vision.  Have a bad-smelling vaginal discharge.  Have thoughts about hurting yourself or your baby. If you ever feel like you may hurt yourself or others, or have thoughts about taking your own life, get help right away. You can go to your nearest emergency department or call:  Your local emergency services (911 in the [place]  A suicide crisis helpline, such as the [facility] at (959)501-6875. This is open 24 hours a day. Summary  The period of time from when you deliver your baby to up to 6-12 weeks after delivery is called the postpartum period.  Gradually return to your normal activities as told by your health care provider.  Keep all follow-up visits for you and your baby as told by your health care provider. This information is not intended to replace advice given to you by your health care provider. Make sure you discuss any questions you have with your health care provider. Document Revised: 03/21/2018 Document Reviewed: 03/21/2018 Elsevier Patient Education  [place] [employer].

## 2020-04-14 NOTE — Progress Notes (Signed)
Patient ID: Birdena Kingma, female   DOB: 1985-09-02, 34 y.o.   MRN: [id number]  Nexplanon Insertion:  Patient given informed consent, signed copy in the chart, time out was performed. Patient s/p c/s. Appropriate time out taken.  Patient's left arm was prepped and draped in the usual sterile fashion.. The ruler used to measure and mark insertion area.  Pt was prepped with alcohol swab and then injected with lidocaine 1% 55mL with epinephrine.  Pt was prepped with betadine, Implanon removed form packaging,  Device confirmed in needle, then inserted full length of needle and withdrawn per handbook instructions.  Device palpated by physician and patient.  Pt insertion site covered with pressure dressing.   Minimal blood loss.  Pt tolerated the procedure well.

## 2020-04-14 NOTE — Lactation Note (Signed)
This note was copied from a baby's chart. Lactation Consultation Note Attempted to see mom, mom sleeping. RN stated mom is formula feeding but also has pumped and given colostrum to baby as well. LC will try to see mom again this shift.  Patient Name: Meredith Jackson HUTML'Y Date: 04/14/2020     Maternal Data    Feeding    LATCH Score                   Interventions    Lactation Tools Discussed/Used     Consult Status      Theodoro Kalata 04/14/2020, 4:24 AM

## 2020-04-16 DIAGNOSIS — Z03818 Encounter for observation for suspected exposure to other biological agents ruled out: Secondary | ICD-10-CM | POA: Diagnosis not present

## 2020-04-22 ENCOUNTER — Ambulatory Visit: Payer: Medicaid Other

## 2020-04-23 ENCOUNTER — Other Ambulatory Visit: Payer: Self-pay

## 2020-04-23 ENCOUNTER — Ambulatory Visit (INDEPENDENT_AMBULATORY_CARE_PROVIDER_SITE_OTHER): Payer: Medicaid Other

## 2020-04-23 VITALS — BP 108/72 | HR 76 | Wt 177.0 lb

## 2020-04-23 DIAGNOSIS — Z5189 Encounter for other specified aftercare: Secondary | ICD-10-CM

## 2020-04-23 NOTE — Progress Notes (Signed)
Provider to bedside after nurse with concern for wound opening.  Provider assesses and wound well healed and approximated.  Surgical glue removed with betadine swab and 4x4.  Lateral area on right side with some mild granulation.  Steri-strip x 2 applied for patient comfort. Patient instructed to remove in 7 days.  Patient to follow up as scheduled.   Maryann Conners, CNM 04/23/2020 4:51 PM

## 2020-04-23 NOTE — Progress Notes (Signed)
Subjective:     Meredith Jackson is a 34 y.o. female who presents to the clinic 1 weeks status post LTCS for Incision check. Eating a regular diet without difficulty. Bowel movements are normal. The patient is not having any pain.   Review of Systems Pertinent items are noted in HPI.    Objective:    BP 108/72   Pulse 76   Wt 177 lb (80.3 kg)   LMP 06/30/2019   Breastfeeding Yes   BMI 31.35 kg/m  General:  alert  Abdomen: soft, bowel sounds active, non-tender  Incision:   healing well, no drainage, no erythema, no hernia, no seroma, no swelling, no dehiscence, incision well approximated     Assessment:    Doing well postoperatively.   Plan:    1. Continue any current medications. 2. Wound care discussed. 3. Activity restrictions: none 4. Anticipated return to work: not applicable. 5. Follow up: 5 weeks for PP visit.   Tamela Oddi, RMA

## 2020-04-30 DIAGNOSIS — Z03818 Encounter for observation for suspected exposure to other biological agents ruled out: Secondary | ICD-10-CM | POA: Diagnosis not present

## 2020-05-14 DIAGNOSIS — Z03818 Encounter for observation for suspected exposure to other biological agents ruled out: Secondary | ICD-10-CM | POA: Diagnosis not present

## 2020-05-25 ENCOUNTER — Other Ambulatory Visit: Payer: Medicaid Other

## 2020-05-25 ENCOUNTER — Ambulatory Visit: Payer: Medicaid Other | Admitting: Obstetrics and Gynecology

## 2020-05-28 DIAGNOSIS — Z03818 Encounter for observation for suspected exposure to other biological agents ruled out: Secondary | ICD-10-CM | POA: Diagnosis not present

## 2020-06-02 ENCOUNTER — Telehealth: Payer: Self-pay | Admitting: Physical Therapy

## 2020-06-02 ENCOUNTER — Encounter: Payer: Medicaid Other | Attending: Obstetrics and Gynecology | Admitting: Physical Therapy

## 2020-06-02 NOTE — Telephone Encounter (Signed)
Called patient about her appointment she no-showed  Today at 9:30. She reports she does not need therapy at this time. She had her baby and feels good.  Earlie Counts, PT @10 /19/2021@ 9:55 AM  '

## 2020-06-09 ENCOUNTER — Encounter: Payer: Medicaid Other | Admitting: Physical Therapy

## 2020-06-11 DIAGNOSIS — Z03818 Encounter for observation for suspected exposure to other biological agents ruled out: Secondary | ICD-10-CM | POA: Diagnosis not present

## 2020-06-16 ENCOUNTER — Encounter: Payer: Medicaid Other | Admitting: Physical Therapy

## 2020-06-23 ENCOUNTER — Encounter: Payer: Medicaid Other | Admitting: Physical Therapy

## 2020-06-25 DIAGNOSIS — Z03818 Encounter for observation for suspected exposure to other biological agents ruled out: Secondary | ICD-10-CM | POA: Diagnosis not present

## 2020-07-08 DIAGNOSIS — Z03818 Encounter for observation for suspected exposure to other biological agents ruled out: Secondary | ICD-10-CM | POA: Diagnosis not present

## 2020-07-23 DIAGNOSIS — Z03818 Encounter for observation for suspected exposure to other biological agents ruled out: Secondary | ICD-10-CM | POA: Diagnosis not present

## 2020-08-05 DIAGNOSIS — Z03818 Encounter for observation for suspected exposure to other biological agents ruled out: Secondary | ICD-10-CM | POA: Diagnosis not present

## 2020-08-20 DIAGNOSIS — Z03818 Encounter for observation for suspected exposure to other biological agents ruled out: Secondary | ICD-10-CM | POA: Diagnosis not present

## 2020-10-01 DIAGNOSIS — Z03818 Encounter for observation for suspected exposure to other biological agents ruled out: Secondary | ICD-10-CM | POA: Diagnosis not present

## 2020-10-15 DIAGNOSIS — Z03818 Encounter for observation for suspected exposure to other biological agents ruled out: Secondary | ICD-10-CM | POA: Diagnosis not present

## 2020-10-29 DIAGNOSIS — Z03818 Encounter for observation for suspected exposure to other biological agents ruled out: Secondary | ICD-10-CM | POA: Diagnosis not present

## 2020-12-15 IMAGING — US US MFM FETAL BPP W/O NON-STRESS
1 series · 15 of 15 positions shown · non-contrast
Comparison: none

[Series 2: us mfm fetal bpp w/o non-stress · 15 acquisitions, 15 frames shown]
[im 1/15]
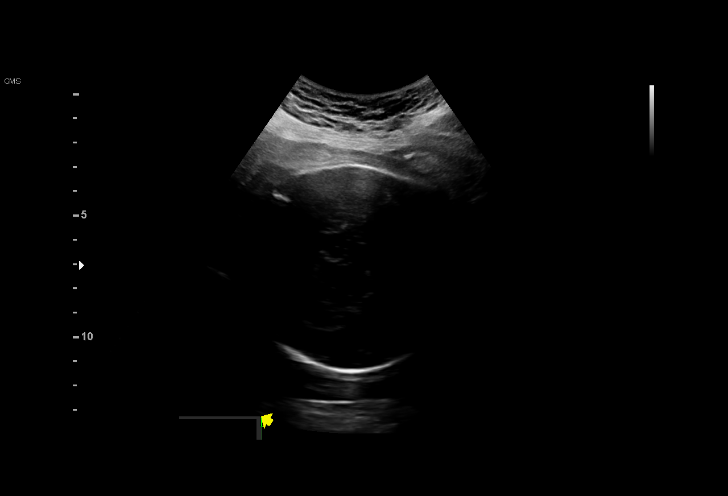
[im 2/15]
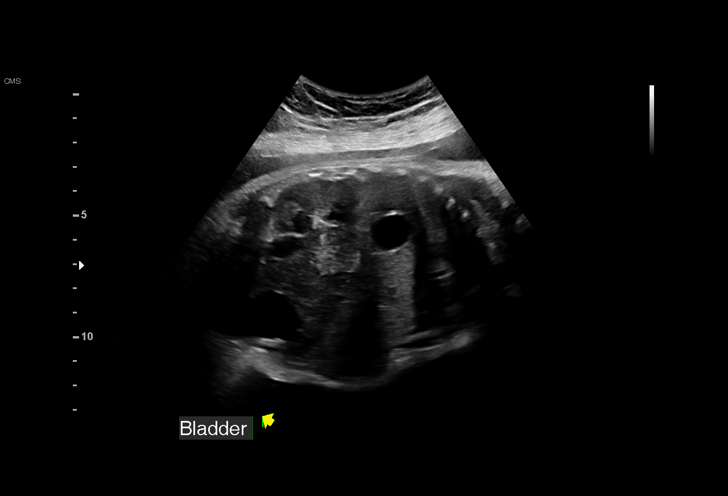
[im 3/15]
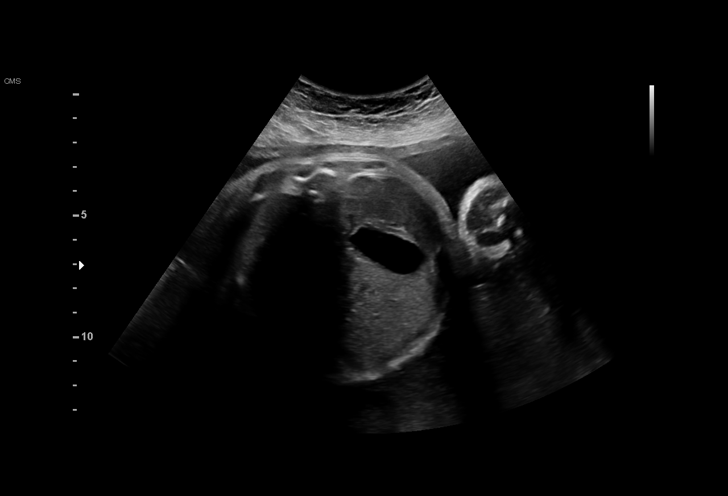
[im 4/15]
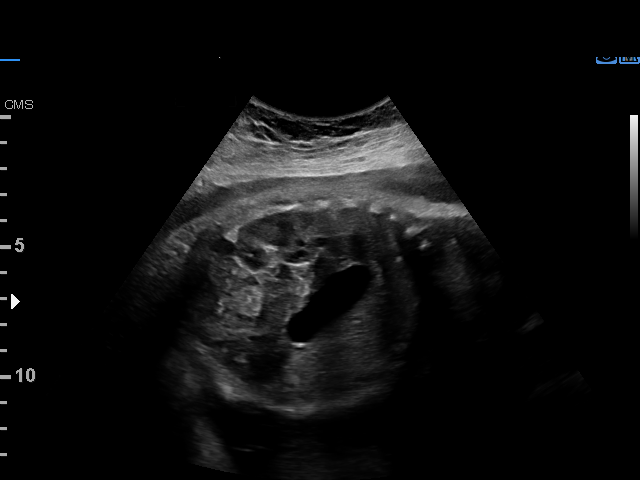
[im 5/15]
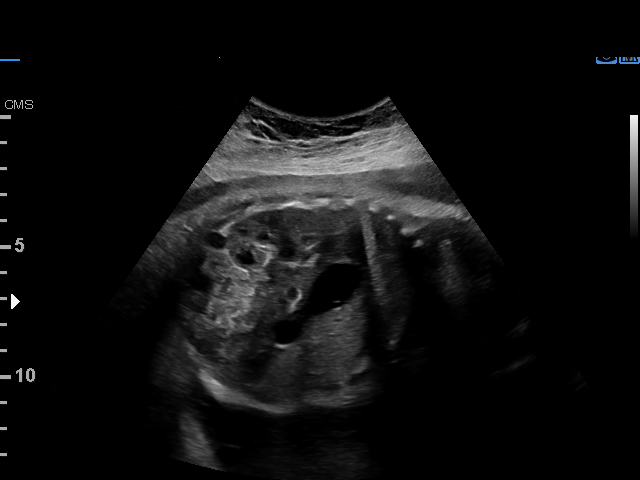
[im 6/15]
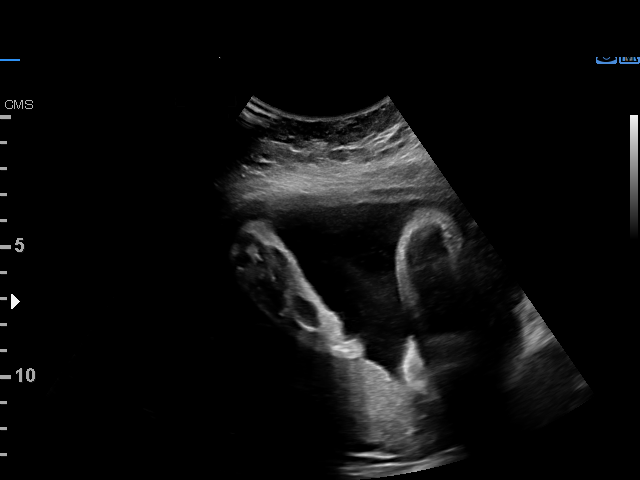
[im 7/15]
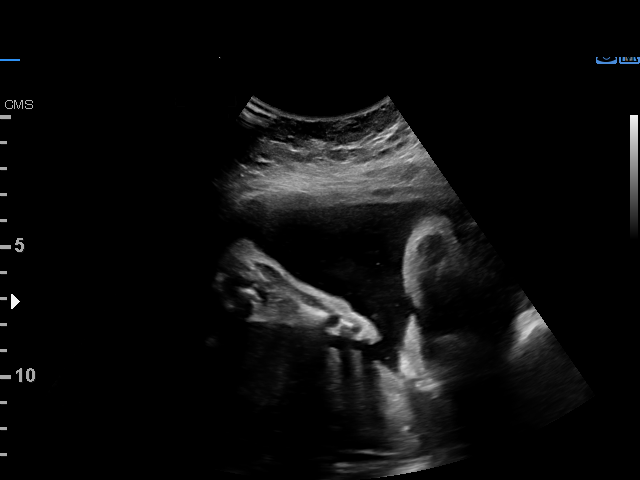
[im 8/15]
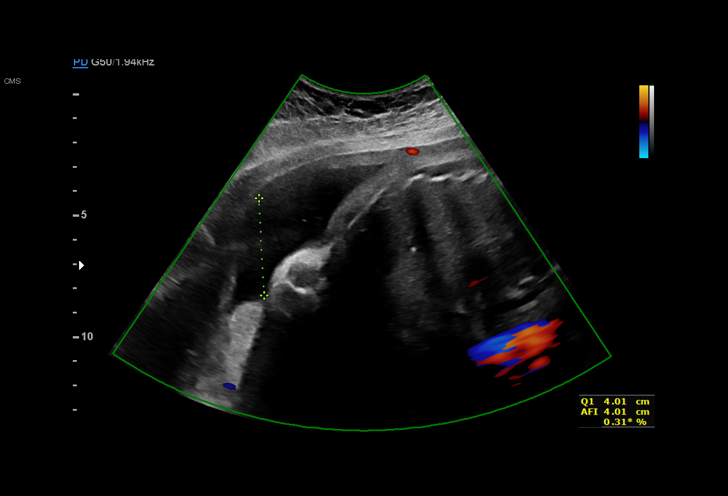
[im 9/15]
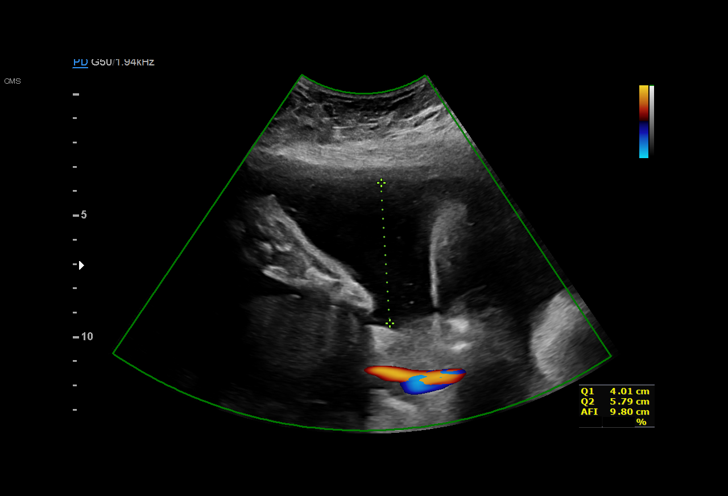
[im 10/15]
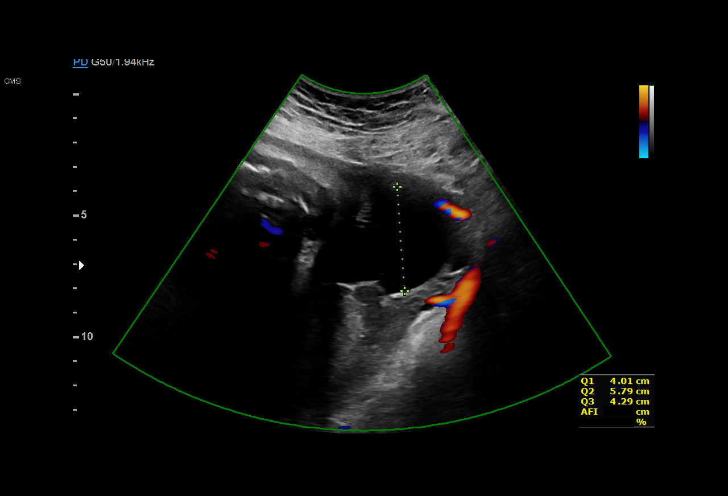
[im 11/15]
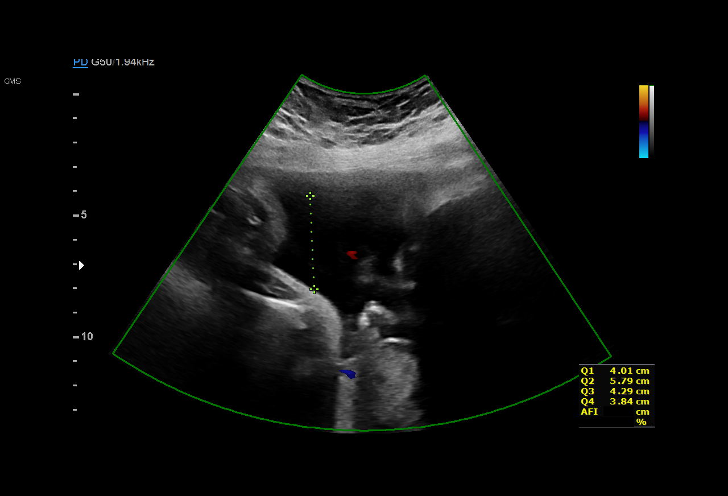
[im 12/15]
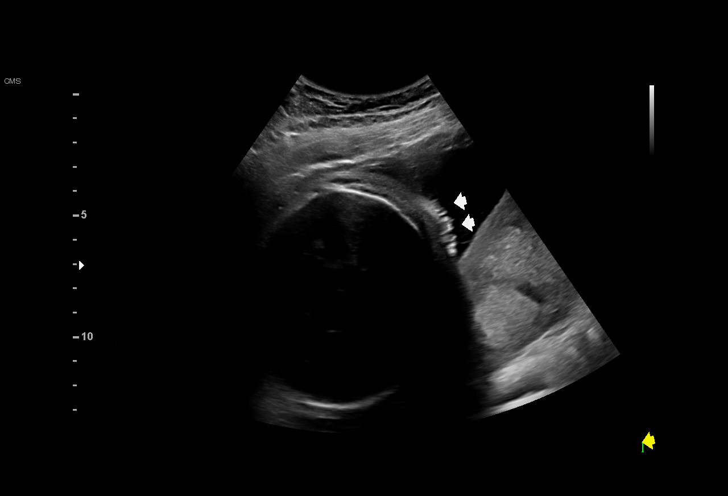
[im 13/15]
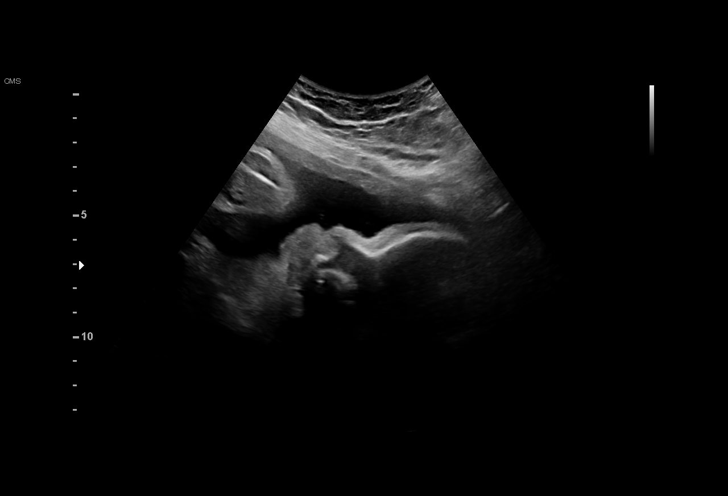
[im 14/15]
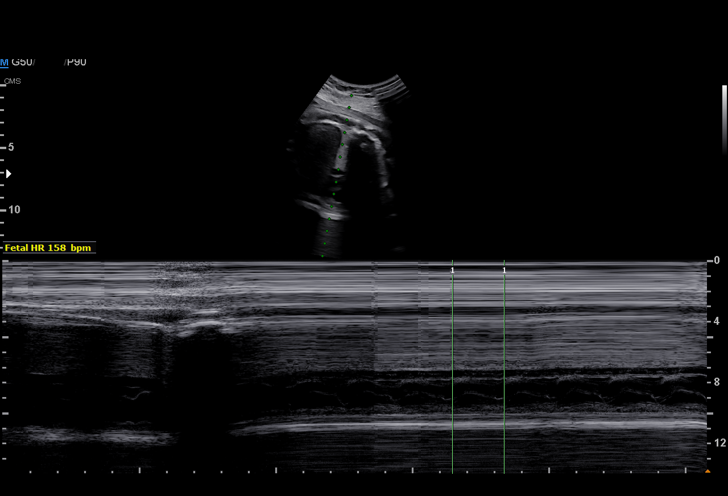
[im 15/15]
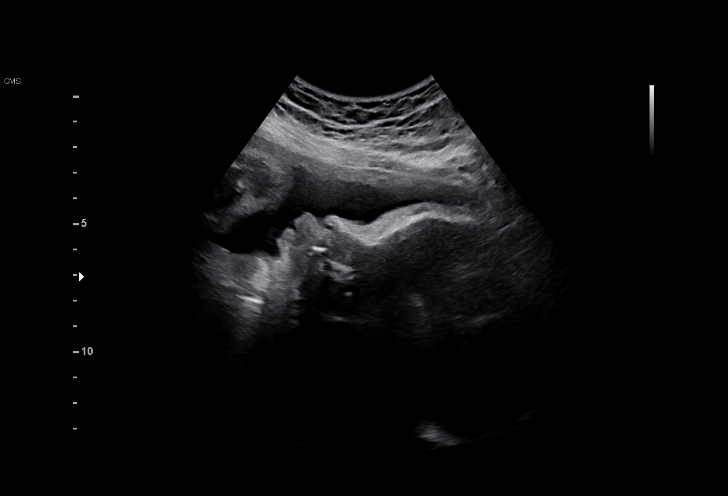

[15 of 15 positions shown; findings below may reference images not displayed]

Indications

 Encounter for other antenatal screening
 follow-up
 Poor obstetric history: Previous IUFD (36
 weeks)/FGR
 Low risk NIPS, Z.1XX, AFP neg
 Previous cesarean delivery, antepartum x2,
 classical
 Genetic carrier (Andelino Saunders and carrier
 medium chain [employer])
 Poor obstetric history: Previous preterm
 delivery, antepartum (IUGR @ 26 weeks,
 classical c/section repeat at 36 weeks)
 Obesity complicating pregnancy, third
 trimester
 Uterine fibroids affecting pregnancy in third  O34.13,
 trimester, antepartum
 34 weeks gestation of pregnancy
 Echogenic intracardiac focus of the heart
 (EIF)
Fetal Evaluation

 Num Of Fetuses:         1
 Fetal Heart Rate(bpm):  158
 Cardiac Activity:       Observed
 Presentation:           Cephalic
 Amniotic Fluid
 AFI FV:      Within normal limits

 AFI Sum(cm)     %Tile       Largest Pocket(cm)
 17.9            66

 RUQ(cm)       RLQ(cm)       LUQ(cm)        LLQ(cm)
 4
Biophysical Evaluation

 Amniotic F.V:   Within normal limits       F. Tone:        Observed
 F. Movement:    Observed                   Score:          [DATE]
 F. Breathing:   Observed
OB History

 Gravidity:    6         Term:   1        Prem:   2        SAB:   2
 TOP:          0       Ectopic:  0        Living: 2
Gestational Age

 LMP:           38w 1d        Date:  06/30/19                 EDD:   04/05/20
 Best:          34w 1d     Det. By:  Early Ultrasound         EDD:   05/03/20
                                     (09/16/19)
Impression

 Gestational diabetes. Well-controlled on diet .
 History of stillbirth.
 History of classical cesarean delivery. Patient will be
 delivered at 37 weeks.

 Amniotic fluid is normal and good fetal activity is seen
 .Antenatal testing is reassuring. BPP [DATE]. Cephalic
 presentation.
Recommendations

 -Continue weekly BPP till delivery.
                 [place], [place]

## 2021-07-15 DIAGNOSIS — Z419 Encounter for procedure for purposes other than remedying health state, unspecified: Secondary | ICD-10-CM | POA: Diagnosis not present

## 2021-08-15 DIAGNOSIS — Z419 Encounter for procedure for purposes other than remedying health state, unspecified: Secondary | ICD-10-CM | POA: Diagnosis not present

## 2021-09-15 DIAGNOSIS — Z419 Encounter for procedure for purposes other than remedying health state, unspecified: Secondary | ICD-10-CM | POA: Diagnosis not present

## 2021-10-13 DIAGNOSIS — Z419 Encounter for procedure for purposes other than remedying health state, unspecified: Secondary | ICD-10-CM | POA: Diagnosis not present

## 2021-11-13 DIAGNOSIS — Z419 Encounter for procedure for purposes other than remedying health state, unspecified: Secondary | ICD-10-CM | POA: Diagnosis not present

## 2021-12-13 DIAGNOSIS — Z419 Encounter for procedure for purposes other than remedying health state, unspecified: Secondary | ICD-10-CM | POA: Diagnosis not present

## 2022-01-13 DIAGNOSIS — Z419 Encounter for procedure for purposes other than remedying health state, unspecified: Secondary | ICD-10-CM | POA: Diagnosis not present

## 2022-02-12 DIAGNOSIS — Z419 Encounter for procedure for purposes other than remedying health state, unspecified: Secondary | ICD-10-CM | POA: Diagnosis not present

## 2022-03-15 DIAGNOSIS — Z419 Encounter for procedure for purposes other than remedying health state, unspecified: Secondary | ICD-10-CM | POA: Diagnosis not present

## 2022-04-15 DIAGNOSIS — Z419 Encounter for procedure for purposes other than remedying health state, unspecified: Secondary | ICD-10-CM | POA: Diagnosis not present

## 2022-05-15 DIAGNOSIS — Z419 Encounter for procedure for purposes other than remedying health state, unspecified: Secondary | ICD-10-CM | POA: Diagnosis not present

## 2022-06-15 DIAGNOSIS — Z419 Encounter for procedure for purposes other than remedying health state, unspecified: Secondary | ICD-10-CM | POA: Diagnosis not present

## 2022-07-15 DIAGNOSIS — Z419 Encounter for procedure for purposes other than remedying health state, unspecified: Secondary | ICD-10-CM | POA: Diagnosis not present

## 2022-08-15 DIAGNOSIS — Z419 Encounter for procedure for purposes other than remedying health state, unspecified: Secondary | ICD-10-CM | POA: Diagnosis not present

## 2022-09-15 DIAGNOSIS — Z419 Encounter for procedure for purposes other than remedying health state, unspecified: Secondary | ICD-10-CM | POA: Diagnosis not present

## 2022-10-14 DIAGNOSIS — Z419 Encounter for procedure for purposes other than remedying health state, unspecified: Secondary | ICD-10-CM | POA: Diagnosis not present

## 2022-11-14 DIAGNOSIS — Z419 Encounter for procedure for purposes other than remedying health state, unspecified: Secondary | ICD-10-CM | POA: Diagnosis not present

## 2022-12-14 DIAGNOSIS — Z419 Encounter for procedure for purposes other than remedying health state, unspecified: Secondary | ICD-10-CM | POA: Diagnosis not present

## 2023-01-14 DIAGNOSIS — Z419 Encounter for procedure for purposes other than remedying health state, unspecified: Secondary | ICD-10-CM | POA: Diagnosis not present

## 2023-02-13 DIAGNOSIS — Z419 Encounter for procedure for purposes other than remedying health state, unspecified: Secondary | ICD-10-CM | POA: Diagnosis not present

## 2023-03-16 DIAGNOSIS — Z419 Encounter for procedure for purposes other than remedying health state, unspecified: Secondary | ICD-10-CM | POA: Diagnosis not present

## 2023-04-16 DIAGNOSIS — Z419 Encounter for procedure for purposes other than remedying health state, unspecified: Secondary | ICD-10-CM | POA: Diagnosis not present

## 2023-05-16 DIAGNOSIS — Z419 Encounter for procedure for purposes other than remedying health state, unspecified: Secondary | ICD-10-CM | POA: Diagnosis not present

## 2023-06-16 DIAGNOSIS — Z419 Encounter for procedure for purposes other than remedying health state, unspecified: Secondary | ICD-10-CM | POA: Diagnosis not present

## 2023-07-16 DIAGNOSIS — Z419 Encounter for procedure for purposes other than remedying health state, unspecified: Secondary | ICD-10-CM | POA: Diagnosis not present

## 2023-08-03 ENCOUNTER — Encounter: Payer: Self-pay | Admitting: Obstetrics and Gynecology

## 2023-08-03 ENCOUNTER — Ambulatory Visit (INDEPENDENT_AMBULATORY_CARE_PROVIDER_SITE_OTHER): Payer: Medicaid Other | Admitting: Obstetrics and Gynecology

## 2023-08-03 ENCOUNTER — Other Ambulatory Visit (HOSPITAL_COMMUNITY)
Admission: RE | Admit: 2023-08-03 | Discharge: 2023-08-03 | Disposition: A | Discharge status: Home / Self Care | Payer: Medicaid Other | Source: Ambulatory Visit | Attending: Obstetrics and Gynecology | Admitting: Obstetrics and Gynecology

## 2023-08-03 VITALS — BP 143/94 | HR 61 | Ht 63.0 in | Wt 165.0 lb

## 2023-08-03 DIAGNOSIS — Z3046 Encounter for surveillance of implantable subdermal contraceptive: Secondary | ICD-10-CM | POA: Diagnosis not present

## 2023-08-03 DIAGNOSIS — N760 Acute vaginitis: Secondary | ICD-10-CM | POA: Insufficient documentation

## 2023-08-03 DIAGNOSIS — Z113 Encounter for screening for infections with a predominantly sexual mode of transmission: Secondary | ICD-10-CM | POA: Diagnosis not present

## 2023-08-03 DIAGNOSIS — Z1339 Encounter for screening examination for other mental health and behavioral disorders: Secondary | ICD-10-CM | POA: Diagnosis not present

## 2023-08-03 DIAGNOSIS — I159 Secondary hypertension, unspecified: Secondary | ICD-10-CM

## 2023-08-03 DIAGNOSIS — Z01419 Encounter for gynecological examination (general) (routine) without abnormal findings: Secondary | ICD-10-CM | POA: Insufficient documentation

## 2023-08-03 MED ORDER — ETONOGESTREL 68 MG ~~LOC~~ IMPL
68.0000 mg | DRUG_IMPLANT | Freq: Once | SUBCUTANEOUS | Status: AC
  Administered 2023-08-03: 68 mg via SUBCUTANEOUS

## 2023-08-03 NOTE — Progress Notes (Addendum)
37 y.o. GYN presents for AEX/PAP/STD screening. BP elevated today, c/o spots in eyes and headaches.   Administrations This Visit     etonogestrel (NEXPLANON) implant 68 mg     Admin Date 08/03/2023 Action Given Dose 68 mg Route Subdermal Documented By Maretta Bees, RMA

## 2023-08-03 NOTE — Progress Notes (Signed)
GYNECOLOGY ANNUAL PREVENTATIVE CARE ENCOUNTER NOTE  History:     Meredith Jackson is a 37 y.o. [phone] female here for a routine annual gynecologic exam.  Current complaints: desires removal and replacement of nexplanon.   Denies abnormal vaginal bleeding, discharge, pelvic pain, problems with intercourse or other gynecologic concerns.    Gynecologic History No LMP recorded. Patient has had an implant. Contraception: Nexplanon Last Pap: 06/05/19. Results were: normal with negative HPV Last mammogram: n/a  Obstetric History OB History  Gravida Para Term Preterm AB Living  6 4 2 2 2 3   SAB IAB Ectopic Multiple Live Births  2   0 3    # Outcome Date GA Lbr Len/2nd Weight Sex Type Anes PTL Lv  6 Term 04/12/20 [redacted]w[redacted]d  6 lb 10.4 oz (3.016 kg) F CS-LTranv Spinal  LIV  5 Preterm 03/09/09 [redacted]w[redacted]d   F CS-LTranv   LIV     Birth Comments: 36wks due to hx classical c/s     Complications: History of cesarean section, classical  4 Preterm 08/22/06 [redacted]w[redacted]d  1 lb 4 oz (0.567 kg) F CS-Classical   LIV     Birth Comments: 26wks Severe IUGR, reversed end diastolic flow     Complications: IUGR (intrauterine growth restriction) affecting care of mother, Oligohydramnios  3 Term 01/09/00 [redacted]w[redacted]d   M Vag-Spont   FD     Birth Comments: Stillbirth  2 SAB           1 SAB             Past Medical History:  Diagnosis Date   Anxiety    Depression    Encounter for Nexplanon removal 09/12/2017   Gestational diabetes     Past Surgical History:  Procedure Laterality Date   CESAREAN SECTION     x2   CESAREAN SECTION N/A 04/12/2020   Procedure: CESAREAN SECTION;  Surgeon: Lazaro Arms, MD;  Location: [facility] LD ORS;  Service: Obstetrics;  Laterality: N/A;    No current outpatient medications on file prior to visit.   No current facility-administered medications on file prior to visit.    No Known Allergies  Social History:  reports that she has been smoking cigarettes. She has never used smokeless tobacco.  She reports current alcohol use. She reports that she does not use drugs.  Family History  Problem Relation Age of Onset   Hyperthyroidism Mother    Diabetes Mother     The following portions of the patient's history were reviewed and updated as appropriate: allergies, current medications, past family history, past medical history, past social history, past surgical history and problem list.  Review of Systems Pertinent items noted in HPI and remainder of comprehensive ROS otherwise negative.  Physical Exam:  BP (!) 154/86 (BP Location: Right Arm, Cuff Size: Normal)   Pulse 63   Ht 5\' 3"  (1.6 m)   Wt 165 lb (74.8 kg)   BMI 29.23 kg/m  CONSTITUTIONAL: Well-developed, well-nourished female in no acute distress.  HENT:  Normocephalic, atraumatic, External right and left ear normal. Oropharynx is clear and moist EYES: Conjunctivae and EOM are normal.  NECK: Normal range of motion, supple, no masses.  Normal thyroid.  SKIN: Skin is warm and dry. No rash noted. Not diaphoretic. No erythema. No pallor. MUSCULOSKELETAL: Normal range of motion. No tenderness.  No cyanosis, clubbing, or edema.  2+ distal pulses. NEUROLOGIC: Alert and oriented to person, place, and time. Normal reflexes, muscle tone coordination.  PSYCHIATRIC:  Normal mood and affect. Normal behavior. Normal judgment and thought content. CARDIOVASCULAR: Normal heart rate noted, regular rhythm RESPIRATORY: Clear to auscultation bilaterally. Effort and breath sounds normal, no problems with respiration noted. BREASTS: Symmetric in size. No masses, tenderness, skin changes, nipple drainage, or lymphadenopathy bilaterally. Performed in the presence of a chaperone. ABDOMEN: Soft, no distention noted.  No tenderness, rebound or guarding.  PELVIC: Normal appearing external genitalia and urethral meatus; normal appearing vaginal mucosa and cervix.  No abnormal discharge noted.  Pap smear obtained.  Vaginal swab taken. Normal uterine  size, no other palpable masses, no uterine or adnexal tenderness.  Performed in the presence of a chaperone.   Assessment and Plan:    1. Women's annual routine gynecological examination [Z01.419] (Primary) Normal annual exam  - Cytology - PAP( [place])  2. Routine screening for STI (sexually transmitted infection) Per pt request - Cervicovaginal ancillary only( [place]) - HIV antibody (with reflex) - Hepatitis C Antibody - Hepatitis B Surface AntiGEN - [facility]  Hypertension: Vitals:   08/03/23 1035 08/03/23 1120  BP: (!) 154/86 (!) 143/94  Pulse: 63 61   BP elevated x 2, will refer to internal medicine for surveillance/management  Will follow up results of pap smear and manage accordingly. Routine preventative health maintenance measures emphasized. Please refer to After Visit Summary for other counseling recommendations.      Mariel Aloe, MD, FACOG Obstetrician & Gynecologist, [facility] for [facility], [facility]

## 2023-08-03 NOTE — Progress Notes (Signed)
     GYNECOLOGY OFFICE PROCEDURE NOTE  Charlierose Stagnitta is a 37 y.o. [phone] here for Nexplanon removal and Nexplanon insertion.  Last pap smear was on 10/20 and was normal.  No other gynecologic concerns.   Nexplanon Removal and Reinsertion Patient identified, informed consent performed, consent signed.   Patient does understand that irregular bleeding is a very common side effect of this medication. She was advised to have backup contraception for one week after replacement of the implant. Pregnancy test in clinic today was negative.  Appropriate time out taken. Nexplanon site identified in left arm.  Area prepped in usual sterile fashon. One ml of 1% lidocaine was used to anesthetize the area at the distal end of the implant. A small stab incision was made right beside the implant on the distal portion. The Nexplanon rod was grasped using hemostats and removed without difficulty. There was minimal blood loss. There were no complications. Area was then injected with 3 ml of 1 % lidocaine. She was re-prepped with betadine, Nexplanon removed from packaging, Device confirmed in needle, then inserted full length of needle and withdrawn per handbook instructions. Nexplanon was able to palpated in the patient's arm; patient palpated the insert herself.  There was minimal blood loss. Patient insertion site covered with gauze and a pressure bandage to reduce any bruising. The patient tolerated the procedure well and was given post procedure instructions.  She was advised to have backup contraception for one week.      Mariel Aloe, MD, FACOG Obstetrician & Gynecologist, [facility] for [facility], [facility]

## 2023-08-04 LAB — CERVICOVAGINAL ANCILLARY ONLY
Bacterial Vaginitis (gardnerella): NEGATIVE
Candida Glabrata: NEGATIVE
Candida Vaginitis: NEGATIVE
Chlamydia: NEGATIVE
Comment: NEGATIVE
Comment: NEGATIVE
Comment: NEGATIVE
Comment: NEGATIVE
Comment: NEGATIVE
Comment: NORMAL
Neisseria Gonorrhea: NEGATIVE
Trichomonas: NEGATIVE

## 2023-08-04 LAB — HEPATITIS B SURFACE ANTIGEN: Hepatitis B Surface Ag: NEGATIVE

## 2023-08-04 LAB — HIV ANTIBODY (ROUTINE TESTING W REFLEX): HIV Screen 4th Generation wRfx: NONREACTIVE

## 2023-08-04 LAB — RPR: RPR Ser Ql: NONREACTIVE

## 2023-08-04 LAB — HEPATITIS C ANTIBODY: Hep C Virus Ab: NONREACTIVE

## 2023-08-08 LAB — CYTOLOGY - PAP
Comment: NEGATIVE
Comment: NEGATIVE
Comment: NEGATIVE
Diagnosis: NEGATIVE
HPV 16: NEGATIVE
HPV 18 / 45: NEGATIVE
High risk HPV: POSITIVE — AB

## 2023-08-16 DIAGNOSIS — Z419 Encounter for procedure for purposes other than remedying health state, unspecified: Secondary | ICD-10-CM | POA: Diagnosis not present

## 2023-09-16 DIAGNOSIS — Z419 Encounter for procedure for purposes other than remedying health state, unspecified: Secondary | ICD-10-CM | POA: Diagnosis not present

## 2023-10-14 DIAGNOSIS — Z419 Encounter for procedure for purposes other than remedying health state, unspecified: Secondary | ICD-10-CM | POA: Diagnosis not present

## 2023-10-26 ENCOUNTER — Ambulatory Visit: Payer: Medicaid Other | Admitting: Nurse Practitioner

## 2023-11-25 DIAGNOSIS — Z419 Encounter for procedure for purposes other than remedying health state, unspecified: Secondary | ICD-10-CM | POA: Diagnosis not present

## 2023-12-14 ENCOUNTER — Ambulatory Visit: Admitting: Family Medicine

## 2023-12-25 DIAGNOSIS — Z419 Encounter for procedure for purposes other than remedying health state, unspecified: Secondary | ICD-10-CM | POA: Diagnosis not present

## 2024-01-25 DIAGNOSIS — Z419 Encounter for procedure for purposes other than remedying health state, unspecified: Secondary | ICD-10-CM | POA: Diagnosis not present

## 2024-01-27 NOTE — Progress Notes (Deleted)
 New Patient Office Visit  Subjective    Patient ID: Meredith Jackson, female    DOB: 1986-03-14  Age: 38 y.o. MRN: [id number]  CC: No chief complaint on file.   HPI Meredith Jackson presents to establish care today. Up to date on routine vaccines. Up to date on routine screenings.  Receives regular dental and eye care.  Reports eating well, sleeping well, feeling well overall.  Reports compliance with medication regimen.  Denies other concerns today.  No outpatient encounter medications on file as of 02/05/2024.   No facility-administered encounter medications on file as of 02/05/2024.    Past Medical History:  Diagnosis Date   Anxiety    Depression    Encounter for Nexplanon  removal 09/12/2017   Gestational diabetes     Past Surgical History:  Procedure Laterality Date   CESAREAN SECTION     x2   CESAREAN SECTION N/A 04/12/2020   Procedure: CESAREAN SECTION;  Surgeon: Wendelyn Halter, MD;  Location: [facility];  Service: Obstetrics;  Laterality: N/A;    Family History  Problem Relation Age of Onset   Hyperthyroidism Mother    Diabetes Mother     Social History   Socioeconomic History   Marital status: Single    Spouse name: Not on file   Number of children: Not on file   Years of education: Not on file   Highest education level: Not on file  Occupational History   Not on file  Tobacco Use   Smoking status: Every Day    Current packs/day: 0.25    Types: Cigarettes   Smokeless tobacco: Never  Vaping Use   Vaping status: Never Used  Substance and Sexual Activity   Alcohol use: Yes    Comment: Occasional   Drug use: No   Sexual activity: Yes    Partners: Male    Birth control/protection: Implant  Other Topics Concern   Not on file  Social History Narrative   Not on file   Social Drivers of Health   Financial Resource Strain: Not on file  Food Insecurity: Not on file  Transportation Needs: Not on file  Physical Activity: Not on file  Stress: Not on file   Social Connections: Not on file  Intimate Partner Violence: Not on file    ROS Per HPI      Objective    There were no vitals taken for this visit.  Physical Exam Vitals and nursing note reviewed.  Constitutional:      General: She is not in acute distress.    Appearance: Normal appearance. She is normal weight.  HENT:     Head: Normocephalic and atraumatic.     Right Ear: External ear normal.     Left Ear: External ear normal.     Nose: Nose normal.     Mouth/Throat:     Mouth: Mucous membranes are moist.     Pharynx: Oropharynx is clear.   Eyes:     Extraocular Movements: Extraocular movements intact.     Pupils: Pupils are equal, round, and reactive to light.    Cardiovascular:     Rate and Rhythm: Normal rate and regular rhythm.     Pulses: Normal pulses.     Heart sounds: Normal heart sounds.  Pulmonary:     Effort: Pulmonary effort is normal. No respiratory distress.     Breath sounds: Normal breath sounds. No wheezing, rhonchi or rales.   Musculoskeletal:  General: Normal range of motion.     Cervical back: Normal range of motion.     Right lower leg: No edema.     Left lower leg: No edema.  Lymphadenopathy:     Cervical: No cervical adenopathy.   Neurological:     General: No focal deficit present.     Mental Status: She is alert and oriented to person, place, and time.   Psychiatric:        Mood and Affect: Mood normal.        Thought Content: Thought content normal.         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   Wellington Half, FNP

## 2024-01-27 NOTE — Patient Instructions (Incomplete)

## 2024-02-05 ENCOUNTER — Ambulatory Visit: Admitting: Family Medicine

## 2024-02-05 DIAGNOSIS — E71311 Medium chain acyl CoA dehydrogenase deficiency: Secondary | ICD-10-CM

## 2024-02-05 DIAGNOSIS — D563 Thalassemia minor: Secondary | ICD-10-CM

## 2024-02-24 DIAGNOSIS — Z419 Encounter for procedure for purposes other than remedying health state, unspecified: Secondary | ICD-10-CM | POA: Diagnosis not present

## 2024-03-26 DIAGNOSIS — Z419 Encounter for procedure for purposes other than remedying health state, unspecified: Secondary | ICD-10-CM | POA: Diagnosis not present

## 2024-04-18 ENCOUNTER — Ambulatory Visit (HOSPITAL_COMMUNITY)
Admission: EM | Admit: 2024-04-18 | Discharge: 2024-04-18 | Disposition: A | Discharge status: Home / Self Care | Attending: Family Medicine | Admitting: Family Medicine

## 2024-04-18 ENCOUNTER — Ambulatory Visit (INDEPENDENT_AMBULATORY_CARE_PROVIDER_SITE_OTHER)

## 2024-04-18 ENCOUNTER — Encounter (HOSPITAL_COMMUNITY): Payer: Self-pay | Admitting: Emergency Medicine

## 2024-04-18 DIAGNOSIS — M7989 Other specified soft tissue disorders: Secondary | ICD-10-CM | POA: Diagnosis not present

## 2024-04-18 DIAGNOSIS — M25522 Pain in left elbow: Secondary | ICD-10-CM | POA: Diagnosis not present

## 2024-04-18 DIAGNOSIS — Z789 Other specified health status: Secondary | ICD-10-CM | POA: Diagnosis not present

## 2024-04-18 LAB — COMPREHENSIVE METABOLIC PANEL WITH GFR
ALT: 33 U/L (ref 0–44)
AST: 74 U/L — ABNORMAL HIGH (ref 15–41)
Albumin: 3.6 g/dL (ref 3.5–5.0)
Alkaline Phosphatase: 74 U/L (ref 38–126)
Anion gap: 17 — ABNORMAL HIGH (ref 5–15)
BUN: 9 mg/dL (ref 6–20)
CO2: 18 mmol/L — ABNORMAL LOW (ref 22–32)
Calcium: 9 mg/dL (ref 8.9–10.3)
Chloride: 105 mmol/L (ref 98–111)
Creatinine, Ser: 0.76 mg/dL (ref 0.44–1.00)
GFR, Estimated: >60 mL/min (ref >60)
Glucose, Bld: 94 mg/dL (ref 70–99)
Potassium: 3.7 mmol/L (ref 3.5–5.1)
Sodium: 140 mmol/L (ref 135–145)
Total Bilirubin: 0.7 mg/dL (ref 0.0–1.2)
Total Protein: 6.8 g/dL (ref 6.5–8.1)

## 2024-04-18 LAB — CBC
HCT: 38.4 % (ref 36.0–46.0)
Hemoglobin: 12.5 g/dL (ref 12.0–15.0)
MCH: 29.7 pg (ref 26.0–34.0)
MCHC: 32.6 g/dL (ref 30.0–36.0)
MCV: 91.2 fL (ref 80.0–100.0)
Platelets: 328 K/uL (ref 150–400)
RBC: 4.21 MIL/uL (ref 3.87–5.11)
RDW: 11.9 % (ref 11.5–15.5)
WBC: 5.2 K/uL (ref 4.0–10.5)
nRBC: 0 % (ref 0.0–0.2)

## 2024-04-18 NOTE — ED Triage Notes (Signed)
 Pt reports fell June 24 when drunk and fell down cement stairs. Reports had an open area but never got seen for it. Reports that still having pain and swelling to left elbow.   Pt also reports that she falls a lot because she drinks a lot and wants to be checked out and get referral for a PCP.

## 2024-04-18 NOTE — ED Provider Notes (Signed)
 [facility]    CSN: [id number] Arrival date & time: 04/18/24  1314      History   Chief Complaint Chief Complaint  Patient presents with   Elbow Injury    HPI Meredith Jackson is a 38 y.o. female.   HPI Here for pain in her left elbow.  On June 24 she fell after drinking a good bit and fell down some steps onto some concrete.  The wound is healed up overall but is remaining swollen and it hurts her a good bit.  She also notes that she at times drinks heavily and is concerned that she might have some liver trouble.  She does not have a primary care.  She has not had a period in a while as she has a Nexplanon .  NKDA Past Medical History:  Diagnosis Date   Anxiety    Depression    Encounter for Nexplanon  removal 09/12/2017   Gestational diabetes     Patient Active Problem List   Diagnosis Date Noted   Acute blood loss anemia 04/13/2020   Cesarean delivery delivered 04/12/2020   History of preterm delivery 03/26/2020   History of prior pregnancy with IUGR newborn 03/26/2020   Gestational diabetes 02/25/2020   Fetal cardiac echogenic focus, antepartum 12/11/2019   Herpes infection during pregnancy, antepartum 11/16/2019   MCAD (medium-chain acyl-CoA dehydrogenase deficiency) (HCC) 10/31/2019   Alpha thalassemia silent carrier 10/31/2019   History of classical cesarean section 10/16/2019   History of IUFD 10/16/2019   Maternal obesity affecting pregnancy, antepartum 10/16/2019   Supervision of other high risk pregnancy, antepartum 09/20/2019    Past Surgical History:  Procedure Laterality Date   CESAREAN SECTION     x2   CESAREAN SECTION N/A 04/12/2020   Procedure: CESAREAN SECTION;  Surgeon: Jayne Vonn DEL, MD;  Location: [facility];  Service: Obstetrics;  Laterality: N/A;    OB History     Gravida  6   Para  4   Term  2   Preterm  2   AB  2   Living  3      SAB  2   IAB      Ectopic      Multiple  0   Live Births  3             Home Medications    Prior to Admission medications   Not on File    Family History Family History  Problem Relation Age of Onset   Hyperthyroidism Mother    Diabetes Mother     Social History Social History   Tobacco Use   Smoking status: Every Day    Current packs/day: 0.25    Types: Cigarettes   Smokeless tobacco: Never  Vaping Use   Vaping status: Never Used  Substance Use Topics   Alcohol use: Yes    Comment: Occasional   Drug use: No     Allergies   Patient has no known allergies.   Review of Systems Review of Systems   Physical Exam Triage Vital Signs ED Triage Vitals  Encounter Vitals Group     BP 04/18/24 1336 125/82     Girls Systolic BP Percentile --      Girls Diastolic BP Percentile --      Boys Systolic BP Percentile --      Boys Diastolic BP Percentile --      Pulse Rate 04/18/24 1336 82     Resp 04/18/24 1336 17  Temp 04/18/24 1336 97.9 F (36.6 C)     Temp Source 04/18/24 1336 Oral     SpO2 04/18/24 1336 98 %     Weight --      Height --      Head Circumference --      Peak Flow --      Pain Score 04/18/24 1334 10     Pain Loc --      Pain Education --      Exclude from Growth Chart --    No data found.  Updated Vital Signs BP 125/82 (BP Location: Right Arm)   Pulse 82   Temp 97.9 F (36.6 C) (Oral)   Resp 17   SpO2 98%   Breastfeeding No   Visual Acuity Right Eye Distance:   Left Eye Distance:   Bilateral Distance:    Right Eye Near:   Left Eye Near:    Bilateral Near:     Physical Exam Vitals reviewed.  Constitutional:      General: She is not in acute distress.    Appearance: She is not ill-appearing, toxic-appearing or diaphoretic.  HENT:     Mouth/Throat:     Mouth: Mucous membranes are moist.  Eyes:     Extraocular Movements: Extraocular movements intact.     Conjunctiva/sclera: Conjunctivae normal.     Pupils: Pupils are equal, round, and reactive to light.  Cardiovascular:     Rate and  Rhythm: Normal rate and regular rhythm.     Heart sounds: No murmur heard. Pulmonary:     Effort: Pulmonary effort is normal.     Breath sounds: Normal breath sounds.  Musculoskeletal:     Cervical back: Neck supple.     Comments: There is some swelling of the left elbow. Healing abrasion  Lymphadenopathy:     Cervical: No cervical adenopathy.  Skin:    Coloration: Skin is not pale.  Neurological:     General: No focal deficit present.     Mental Status: She is alert and oriented to person, place, and time.  Psychiatric:        Behavior: Behavior normal.      UC Treatments / Results  Labs (all labs ordered are listed, but only abnormal results are displayed) Labs Reviewed  CBC  COMPREHENSIVE METABOLIC PANEL WITH GFR    EKG   Radiology DG Elbow Complete Left Result Date: 04/18/2024 CLINICAL DATA:  Left elbow pain and swelling EXAM: LEFT ELBOW - COMPLETE 3+ VIEW COMPARISON:  None Available. FINDINGS: There is no evidence of fracture, dislocation, or joint effusion. There is no evidence of arthropathy or other focal bone abnormality. Partially imaged radiodense foreign body in the left upper arm, likely resent contraceptive device. IMPRESSION: No acute osseous findings. Likely linear radiodense contraceptive device in the upper arm soft tissues. Electronically Signed   By: Michaeline Blanch M.D.   On: 04/18/2024 14:43    Procedures Procedures (including critical care time)  Medications Ordered in UC Medications - No data to display  Initial Impression / Assessment and Plan / UC Course  I have reviewed the triage vital signs and the nursing notes.  Pertinent labs & imaging results that were available during my care of the patient were reviewed by me and considered in my medical decision making (see chart for details).     X-rays negative for fractures.  Ibuprofen  is sent in for her pain.  CBC and CMP are drawn today.  Staff will set her up  a primary care  appointment. Final Clinical Impressions(s) / UC Diagnoses   Final diagnoses:  Left elbow pain  Alcohol consumption heavy     Discharge Instructions      Your x-rays were negative for any broken bones.  Take ibuprofen  600 mg--1 tab every 8 hours as needed for pain.  We have drawn blood to check blood counts and kidney and liver function.  Staff will notify you if there is anything significantly abnormal  You can use the QR code/website at the back of the summary paperwork to schedule yourself a new patient appointment with primary care       ED Prescriptions   None    PDMP not reviewed this encounter.   Vonna Sharlet POUR, MD 04/18/24 1455

## 2024-04-18 NOTE — Discharge Instructions (Signed)
 Your x-rays were negative for any broken bones.  Take ibuprofen  600 mg--1 tab every 8 hours as needed for pain.  We have drawn blood to check blood counts and kidney and liver function.  Staff will notify you if there is anything significantly abnormal  You can use the QR code/website at the back of the summary paperwork to schedule yourself a new patient appointment with primary care

## 2024-04-19 ENCOUNTER — Ambulatory Visit (HOSPITAL_COMMUNITY): Payer: Self-pay

## 2024-04-26 DIAGNOSIS — Z419 Encounter for procedure for purposes other than remedying health state, unspecified: Secondary | ICD-10-CM | POA: Diagnosis not present

## 2024-05-27 DIAGNOSIS — Z6830 Body mass index (BMI) 30.0-30.9, adult: Secondary | ICD-10-CM | POA: Diagnosis not present

## 2024-05-27 DIAGNOSIS — Z5941 Food insecurity: Secondary | ICD-10-CM | POA: Diagnosis not present

## 2024-05-27 DIAGNOSIS — Z5948 Other specified lack of adequate food: Secondary | ICD-10-CM | POA: Diagnosis not present

## 2024-05-27 DIAGNOSIS — Z8249 Family history of ischemic heart disease and other diseases of the circulatory system: Secondary | ICD-10-CM | POA: Diagnosis not present

## 2024-05-27 DIAGNOSIS — Z833 Family history of diabetes mellitus: Secondary | ICD-10-CM | POA: Diagnosis not present

## 2024-05-27 DIAGNOSIS — R32 Unspecified urinary incontinence: Secondary | ICD-10-CM | POA: Diagnosis not present

## 2024-05-27 DIAGNOSIS — Z8049 Family history of malignant neoplasm of other genital organs: Secondary | ICD-10-CM | POA: Diagnosis not present

## 2024-05-27 DIAGNOSIS — Z87891 Personal history of nicotine dependence: Secondary | ICD-10-CM | POA: Diagnosis not present

## 2024-05-27 DIAGNOSIS — E669 Obesity, unspecified: Secondary | ICD-10-CM | POA: Diagnosis not present

## 2024-05-27 DIAGNOSIS — R030 Elevated blood-pressure reading, without diagnosis of hypertension: Secondary | ICD-10-CM | POA: Diagnosis not present

## 2024-07-26 DIAGNOSIS — Z419 Encounter for procedure for purposes other than remedying health state, unspecified: Secondary | ICD-10-CM | POA: Diagnosis not present
